# Patient Record
Sex: Male | Born: 1940 | Race: Black or African American | Hispanic: No | Marital: Single
Health system: Midwestern US, Community
[De-identification: ages and names within clinical notes are randomized; demographics above are authoritative.]

## PROBLEM LIST (undated history)

## (undated) DIAGNOSIS — Z85828 Personal history of other malignant neoplasm of skin: Secondary | ICD-10-CM

## (undated) DIAGNOSIS — M353 Polymyalgia rheumatica: Secondary | ICD-10-CM

## (undated) DIAGNOSIS — N400 Enlarged prostate without lower urinary tract symptoms: Secondary | ICD-10-CM

## (undated) DIAGNOSIS — R351 Nocturia: Secondary | ICD-10-CM

## (undated) DIAGNOSIS — Z973 Presence of spectacles and contact lenses: Secondary | ICD-10-CM

## (undated) DIAGNOSIS — D696 Thrombocytopenia, unspecified: Secondary | ICD-10-CM

## (undated) DIAGNOSIS — N183 Chronic kidney disease, stage 3 unspecified: Secondary | ICD-10-CM

## (undated) DIAGNOSIS — E538 Deficiency of other specified B group vitamins: Secondary | ICD-10-CM

## (undated) DIAGNOSIS — I10 Essential (primary) hypertension: Secondary | ICD-10-CM

## (undated) DIAGNOSIS — N200 Calculus of kidney: Secondary | ICD-10-CM

## (undated) DIAGNOSIS — Z87442 Personal history of urinary calculi: Secondary | ICD-10-CM

## (undated) DIAGNOSIS — R7303 Prediabetes: Secondary | ICD-10-CM

## (undated) HISTORY — PX: TONSILLECTOMY: SUR1361

## (undated) HISTORY — PX: CATARACT EXTRACTION: SUR2

---

## 1979-06-17 HISTORY — PX: CYSTOSCOPY: SUR368

## 2006-06-01 ENCOUNTER — Ambulatory Visit: Payer: Self-pay | Admitting: Internal Medicine

## 2006-06-12 ENCOUNTER — Ambulatory Visit: Payer: Self-pay | Admitting: Internal Medicine

## 2015-07-05 DIAGNOSIS — R8290 Unspecified abnormal findings in urine: Secondary | ICD-10-CM | POA: Diagnosis not present

## 2015-07-05 DIAGNOSIS — Z0000 Encounter for general adult medical examination without abnormal findings: Secondary | ICD-10-CM | POA: Diagnosis not present

## 2015-07-05 DIAGNOSIS — Z125 Encounter for screening for malignant neoplasm of prostate: Secondary | ICD-10-CM | POA: Diagnosis not present

## 2015-07-12 DIAGNOSIS — Z0000 Encounter for general adult medical examination without abnormal findings: Secondary | ICD-10-CM | POA: Diagnosis not present

## 2015-07-12 DIAGNOSIS — Z1389 Encounter for screening for other disorder: Secondary | ICD-10-CM | POA: Diagnosis not present

## 2015-07-12 DIAGNOSIS — N390 Urinary tract infection, site not specified: Secondary | ICD-10-CM | POA: Diagnosis not present

## 2015-07-12 DIAGNOSIS — N401 Enlarged prostate with lower urinary tract symptoms: Secondary | ICD-10-CM | POA: Diagnosis not present

## 2015-07-12 DIAGNOSIS — Z6827 Body mass index (BMI) 27.0-27.9, adult: Secondary | ICD-10-CM | POA: Diagnosis not present

## 2015-07-12 DIAGNOSIS — R319 Hematuria, unspecified: Secondary | ICD-10-CM | POA: Diagnosis not present

## 2015-07-12 DIAGNOSIS — M1990 Unspecified osteoarthritis, unspecified site: Secondary | ICD-10-CM | POA: Diagnosis not present

## 2015-07-30 DIAGNOSIS — Z1212 Encounter for screening for malignant neoplasm of rectum: Secondary | ICD-10-CM | POA: Diagnosis not present

## 2015-09-06 DIAGNOSIS — R3121 Asymptomatic microscopic hematuria: Secondary | ICD-10-CM | POA: Diagnosis not present

## 2015-10-04 DIAGNOSIS — N200 Calculus of kidney: Secondary | ICD-10-CM | POA: Diagnosis not present

## 2015-10-04 DIAGNOSIS — R3129 Other microscopic hematuria: Secondary | ICD-10-CM | POA: Diagnosis not present

## 2015-10-04 DIAGNOSIS — R3121 Asymptomatic microscopic hematuria: Secondary | ICD-10-CM | POA: Diagnosis not present

## 2015-10-07 DIAGNOSIS — H353132 Nonexudative age-related macular degeneration, bilateral, intermediate dry stage: Secondary | ICD-10-CM | POA: Diagnosis not present

## 2015-11-24 ENCOUNTER — Other Ambulatory Visit: Payer: Self-pay | Admitting: Urology

## 2015-11-25 NOTE — Patient Instructions (Addendum)
YOUR PROCEDURE IS SCHEDULED ON :  12/09/15  REPORT TO [facility] MAIN ENTRANCE FOLLOW SIGNS TO EAST ELEVATOR - GO TO 3rd FLOOR CHECK IN AT 3 EAST NURSES STATION (SHORT STAY) AT: 5:30 AM  CALL THIS NUMBER IF YOU HAVE PROBLEMS THE MORNING OF SURGERY [phone]  REMEMBER:ONLY 1 PER PERSON MAY GO TO SHORT STAY WITH YOU TO GET READY THE MORNING OF YOUR SURGERY  DO NOT EAT FOOD OR DRINK LIQUIDS AFTER MIDNIGHT  TAKE THESE MEDICINES THE MORNING OF SURGERY: NONE  YOU MAY NOT HAVE ANY METAL ON YOUR BODY INCLUDING HAIR PINS AND PIERCING'S. DO NOT WEAR JEWELRY, MAKEUP, LOTIONS, POWDERS OR PERFUMES. DO NOT WEAR NAIL POLISH. DO NOT SHAVE 48 HRS PRIOR TO SURGERY. MEN MAY SHAVE FACE AND NECK.  DO NOT BRING VALUABLES TO HOSPITAL. [facility] IS NOT RESPONSIBLE FOR VALUABLES.  CONTACTS, DENTURES OR PARTIALS MAY NOT BE WORN TO SURGERY. LEAVE SUITCASE IN CAR. CAN BE BROUGHT TO ROOM AFTER SURGERY.  PATIENTS DISCHARGED THE DAY OF SURGERY WILL NOT BE ALLOWED TO DRIVE HOME.  PLEASE READ OVER THE FOLLOWING INSTRUCTION SHEETS _________________________________________________________________________________                                          [facility] - PREPARING FOR SURGERY  Before surgery, you can play an important role.  Because skin is not sterile, your skin needs to be as free of germs as possible.  You can reduce the number of germs on your skin by washing with CHG (chlorahexidine gluconate) soap before surgery.  CHG is an antiseptic cleaner which kills germs and bonds with the skin to continue killing germs even after washing. Please DO NOT use if you have an allergy to CHG or antibacterial soaps.  If your skin becomes reddened/irritated stop using the CHG and inform your nurse when you arrive at Short Stay. Do not shave (including legs and underarms) for at least 48 hours prior to the first CHG shower.  You may shave your face. Please follow these instructions  carefully:   1.  Shower with CHG Soap the night before surgery and the  morning of Surgery.   2.  If you choose to wash your hair, wash your hair first as usual with your  normal  Shampoo.   3.  After you shampoo, rinse your hair and body thoroughly to remove the  shampoo.                                         4.  Use CHG as you would any other liquid soap.  You can apply chg directly  to the skin and wash . Gently wash with scrungie or clean wascloth    5.  Apply the CHG Soap to your body ONLY FROM THE NECK DOWN.   Do not use on open                           Wound or open sores. Avoid contact with eyes, ears mouth and genitals (private parts).                        Genitals (private parts) with your normal soap.  6.  Wash thoroughly, paying special attention to the area where your surgery  will be performed.   7.  Thoroughly rinse your body with warm water from the neck down.   8.  DO NOT shower/wash with your normal soap after using and rinsing off  the CHG Soap .                9.  Pat yourself dry with a clean towel.             10.  Wear clean night clothes to bed after shower             11.  Place clean sheets on your bed the night of your first shower and do not  sleep with pets.  Day of Surgery : Do not apply any lotions/deodorants the morning of surgery.  Please wear clean clothes to the hospital/surgery center.  FAILURE TO FOLLOW THESE INSTRUCTIONS MAY RESULT IN THE CANCELLATION OF YOUR SURGERY    PATIENT SIGNATURE_________________________________  ______________________________________________________________________                     CLEAR LIQUID DIET   Foods Allowed                                                                     Foods Excluded  Coffee and tea, regular and decaf                             liquids that you cannot  Plain Jell-O in any flavor                                             see through such  as: Fruit ices (not with fruit pulp)                                     milk, soups, orange juice  Iced Popsicles                                                        All solid food Carbonated beverages, regular and diet                                    Cranberry, grape and apple juices Sports drinks like Gatorade Lightly seasoned clear broth or consume(fat free) Sugar, honey syrup

## 2015-11-26 ENCOUNTER — Encounter (HOSPITAL_COMMUNITY)
Admission: RE | Admit: 2015-11-26 | Discharge: 2015-11-26 | Disposition: A | Discharge status: Home / Self Care | Payer: Medicare Other | Source: Ambulatory Visit | Attending: Urology | Admitting: Urology

## 2015-11-26 ENCOUNTER — Encounter (HOSPITAL_COMMUNITY): Payer: Self-pay

## 2015-11-26 DIAGNOSIS — N200 Calculus of kidney: Secondary | ICD-10-CM | POA: Diagnosis not present

## 2015-11-26 DIAGNOSIS — Z01812 Encounter for preprocedural laboratory examination: Secondary | ICD-10-CM | POA: Insufficient documentation

## 2015-11-26 HISTORY — DX: Personal history of other malignant neoplasm of skin: Z85.828

## 2015-11-26 HISTORY — DX: Nocturia: R35.1

## 2015-11-26 LAB — CBC
HCT: 46.8 % (ref 39.0–52.0)
HEMOGLOBIN: 15.6 g/dL (ref 13.0–17.0)
MCH: 31.5 pg (ref 26.0–34.0)
MCHC: 33.3 g/dL (ref 30.0–36.0)
MCV: 94.4 fL (ref 78.0–100.0)
PLATELETS: 143 10*3/uL — AB (ref 150–400)
RBC: 4.96 MIL/uL (ref 4.22–5.81)
RDW: 13.5 % (ref 11.5–15.5)
WBC: 5.9 10*3/uL (ref 4.0–10.5)

## 2015-11-26 LAB — BASIC METABOLIC PANEL
ANION GAP: 9 (ref 5–15)
BUN: 20 mg/dL (ref 6–20)
CALCIUM: 9.3 mg/dL (ref 8.9–10.3)
CO2: 29 mmol/L (ref 22–32)
CREATININE: 1.05 mg/dL (ref 0.61–1.24)
Chloride: 106 mmol/L (ref 101–111)
Glucose, Bld: 91 mg/dL (ref 65–99)
Potassium: 4.5 mmol/L (ref 3.5–5.1)
Sodium: 144 mmol/L (ref 135–145)

## 2015-12-08 ENCOUNTER — Encounter (HOSPITAL_COMMUNITY): Payer: Self-pay | Admitting: Anesthesiology

## 2015-12-08 NOTE — Anesthesia Preprocedure Evaluation (Signed)
Anesthesia Evaluation  Patient identified by MRN, date of birth, ID band Patient awake    Reviewed: Allergy & Precautions, NPO status , Patient's Chart, lab work & pertinent test results  Airway Mallampati: II  TM Distance: >3 FB Neck ROM: Full    Dental no notable dental hx.    Pulmonary former smoker,    Pulmonary exam normal breath sounds clear to auscultation       Cardiovascular negative cardio ROS Normal cardiovascular exam Rhythm:Regular Rate:Normal     Neuro/Psych negative neurological ROS  negative psych ROS   GI/Hepatic negative GI ROS, Neg liver ROS,   Endo/Other  negative endocrine ROS  Renal/GU Renal disease  negative genitourinary   Musculoskeletal negative musculoskeletal ROS (+)   Abdominal   Peds negative pediatric ROS (+)  Hematology negative hematology ROS (+)   Anesthesia Other Findings   Reproductive/Obstetrics negative OB ROS                             Anesthesia Physical Anesthesia Plan  ASA: II  Anesthesia Plan: General   Post-op Pain Management:    Induction: Intravenous  Airway Management Planned: Oral ETT  Additional Equipment:   Intra-op Plan:   Post-operative Plan: Extubation in OR  Informed Consent: I have reviewed the patients History and Physical, chart, labs and discussed the procedure including the risks, benefits and alternatives for the proposed anesthesia with the patient or authorized representative who has indicated his/her understanding and acceptance.   Dental advisory given  Plan Discussed with: CRNA  Anesthesia Plan Comments:         Anesthesia Quick Evaluation

## 2015-12-09 ENCOUNTER — Encounter (HOSPITAL_COMMUNITY): Payer: Self-pay | Admitting: *Deleted

## 2015-12-09 ENCOUNTER — Ambulatory Visit (HOSPITAL_COMMUNITY): Payer: Medicare Other

## 2015-12-09 ENCOUNTER — Ambulatory Visit (HOSPITAL_COMMUNITY): Payer: Medicare Other | Admitting: Anesthesiology

## 2015-12-09 ENCOUNTER — Encounter (HOSPITAL_COMMUNITY)
Admission: RE | Disposition: A | Discharge status: Home / Self Care | Payer: Self-pay | Source: Ambulatory Visit | Attending: Urology

## 2015-12-09 ENCOUNTER — Observation Stay (HOSPITAL_COMMUNITY)
Admission: RE | Admit: 2015-12-09 | Discharge: 2015-12-10 | Disposition: A | Discharge status: Home / Self Care | Payer: Medicare Other | Source: Ambulatory Visit | Attending: Urology | Admitting: Urology

## 2015-12-09 DIAGNOSIS — Z87442 Personal history of urinary calculi: Secondary | ICD-10-CM | POA: Diagnosis not present

## 2015-12-09 DIAGNOSIS — Z79899 Other long term (current) drug therapy: Secondary | ICD-10-CM | POA: Diagnosis not present

## 2015-12-09 DIAGNOSIS — N200 Calculus of kidney: Secondary | ICD-10-CM | POA: Diagnosis present

## 2015-12-09 DIAGNOSIS — Z87891 Personal history of nicotine dependence: Secondary | ICD-10-CM | POA: Diagnosis not present

## 2015-12-09 HISTORY — PX: NEPHROLITHOTOMY: SHX5134

## 2015-12-09 LAB — CBC
HCT: 45.2 % (ref 39.0–52.0)
Hemoglobin: 15 g/dL (ref 13.0–17.0)
MCH: 31.9 pg (ref 26.0–34.0)
MCHC: 33.2 g/dL (ref 30.0–36.0)
MCV: 96.2 fL (ref 78.0–100.0)
PLATELETS: 168 10*3/uL (ref 150–400)
RBC: 4.7 MIL/uL (ref 4.22–5.81)
RDW: 13.6 % (ref 11.5–15.5)
WBC: 12 10*3/uL — ABNORMAL HIGH (ref 4.0–10.5)

## 2015-12-09 LAB — BASIC METABOLIC PANEL
Anion gap: 10 (ref 5–15)
BUN: 17 mg/dL (ref 6–20)
CALCIUM: 8.1 mg/dL — AB (ref 8.9–10.3)
CO2: 24 mmol/L (ref 22–32)
CREATININE: 1.69 mg/dL — AB (ref 0.61–1.24)
Chloride: 105 mmol/L (ref 101–111)
GFR, EST AFRICAN AMERICAN: 44 mL/min — AB (ref >60)
GFR, EST NON AFRICAN AMERICAN: 38 mL/min — AB (ref >60)
Glucose, Bld: 165 mg/dL — ABNORMAL HIGH (ref 65–99)
Potassium: 3.8 mmol/L (ref 3.5–5.1)
SODIUM: 139 mmol/L (ref 135–145)

## 2015-12-09 SURGERY — NEPHROLITHOTOMY PERCUTANEOUS
Anesthesia: General | Site: Back | Laterality: Right

## 2015-12-09 MED ORDER — MENTHOL 3 MG MT LOZG
1.0000 | LOZENGE | OROMUCOSAL | Status: DC | PRN
  Filled 2015-12-09: qty 9

## 2015-12-09 MED ORDER — INFLUENZA VAC SPLIT QUAD 0.5 ML IM SUSY
0.5000 mL | PREFILLED_SYRINGE | INTRAMUSCULAR | Status: DC
  Filled 2015-12-09 (×2): qty 0.5

## 2015-12-09 MED ORDER — ONDANSETRON HCL 4 MG/2ML IJ SOLN
INTRAMUSCULAR | Status: DC | PRN
  Administered 2015-12-09: 4 mg via INTRAVENOUS

## 2015-12-09 MED ORDER — ONDANSETRON HCL 4 MG/2ML IJ SOLN: 4.0000 mg | INTRAMUSCULAR | Status: DC | PRN

## 2015-12-09 MED ORDER — DEXAMETHASONE SODIUM PHOSPHATE 10 MG/ML IJ SOLN
INTRAMUSCULAR | Status: AC
  Filled 2015-12-09: qty 1

## 2015-12-09 MED ORDER — PROPOFOL 10 MG/ML IV BOLUS
INTRAVENOUS | Status: AC
  Filled 2015-12-09: qty 20

## 2015-12-09 MED ORDER — CEFAZOLIN SODIUM-DEXTROSE 2-3 GM-% IV SOLR
2.0000 g | INTRAVENOUS | Status: AC
  Administered 2015-12-09: 2 g via INTRAVENOUS

## 2015-12-09 MED ORDER — SUCCINYLCHOLINE CHLORIDE 20 MG/ML IJ SOLN
INTRAMUSCULAR | Status: DC | PRN
  Administered 2015-12-09: 100 mg via INTRAVENOUS

## 2015-12-09 MED ORDER — HYDRALAZINE HCL 20 MG/ML IJ SOLN
5.0000 mg | INTRAMUSCULAR | Status: DC | PRN
  Administered 2015-12-10: 5 mg via INTRAVENOUS
  Filled 2015-12-09: qty 1

## 2015-12-09 MED ORDER — OXYCODONE HCL 5 MG PO TABS: 5.0000 mg | ORAL_TABLET | ORAL | Status: DC | PRN

## 2015-12-09 MED ORDER — LIDOCAINE-EPINEPHRINE (PF) 1 %-1:200000 IJ SOLN
INTRAMUSCULAR | Status: AC
  Filled 2015-12-09: qty 30

## 2015-12-09 MED ORDER — PROMETHAZINE HCL 25 MG/ML IJ SOLN: 6.2500 mg | INTRAMUSCULAR | Status: DC | PRN

## 2015-12-09 MED ORDER — LIDOCAINE HCL (CARDIAC) 20 MG/ML IV SOLN
INTRAVENOUS | Status: AC
  Filled 2015-12-09: qty 5

## 2015-12-09 MED ORDER — FUROSEMIDE 10 MG/ML IJ SOLN
INTRAMUSCULAR | Status: AC
  Filled 2015-12-09: qty 4

## 2015-12-09 MED ORDER — ROCURONIUM BROMIDE 100 MG/10ML IV SOLN
INTRAVENOUS | Status: AC
  Filled 2015-12-09: qty 1

## 2015-12-09 MED ORDER — FENTANYL CITRATE (PF) 250 MCG/5ML IJ SOLN
INTRAMUSCULAR | Status: AC
  Filled 2015-12-09: qty 5

## 2015-12-09 MED ORDER — EPHEDRINE SULFATE 50 MG/ML IJ SOLN
INTRAMUSCULAR | Status: AC
  Filled 2015-12-09: qty 1

## 2015-12-09 MED ORDER — CIPROFLOXACIN IN D5W 400 MG/200ML IV SOLN
400.0000 mg | INTRAVENOUS | Status: AC
Start: 2015-12-09 — End: 2015-12-09
  Administered 2015-12-09: 400 mg via INTRAVENOUS

## 2015-12-09 MED ORDER — PHENOL 1.4 % MT LIQD
1.0000 | OROMUCOSAL | Status: DC | PRN
  Filled 2015-12-09: qty 177

## 2015-12-09 MED ORDER — PHENYLEPHRINE HCL 10 MG/ML IJ SOLN
INTRAMUSCULAR | Status: AC
  Filled 2015-12-09: qty 1

## 2015-12-09 MED ORDER — IOHEXOL 300 MG/ML  SOLN
INTRAMUSCULAR | Status: DC | PRN
  Administered 2015-12-09: 30 mL

## 2015-12-09 MED ORDER — PROPOFOL 10 MG/ML IV BOLUS
INTRAVENOUS | Status: DC | PRN
  Administered 2015-12-09: 200 mg via INTRAVENOUS

## 2015-12-09 MED ORDER — SUGAMMADEX SODIUM 200 MG/2ML IV SOLN
INTRAVENOUS | Status: AC
  Filled 2015-12-09: qty 2

## 2015-12-09 MED ORDER — CEFAZOLIN SODIUM-DEXTROSE 2-3 GM-% IV SOLR
INTRAVENOUS | Status: AC
  Filled 2015-12-09: qty 50

## 2015-12-09 MED ORDER — LACTATED RINGERS IV SOLN
INTRAVENOUS | Status: DC | PRN
  Administered 2015-12-09 (×2): via INTRAVENOUS

## 2015-12-09 MED ORDER — LACTATED RINGERS IV SOLN: INTRAVENOUS | Status: DC

## 2015-12-09 MED ORDER — SODIUM CHLORIDE 0.9 % IJ SOLN
INTRAMUSCULAR | Status: AC
Start: 2015-12-09 — End: 2015-12-09
  Filled 2015-12-09: qty 10

## 2015-12-09 MED ORDER — ACETAMINOPHEN 10 MG/ML IV SOLN
1000.0000 mg | Freq: Four times a day (QID) | INTRAVENOUS | Status: AC
  Administered 2015-12-09 – 2015-12-10 (×4): 1000 mg via INTRAVENOUS
  Filled 2015-12-09 (×3): qty 100

## 2015-12-09 MED ORDER — SUGAMMADEX SODIUM 200 MG/2ML IV SOLN
INTRAVENOUS | Status: DC | PRN
  Administered 2015-12-09: 200 mg via INTRAVENOUS

## 2015-12-09 MED ORDER — FENTANYL CITRATE (PF) 100 MCG/2ML IJ SOLN
INTRAMUSCULAR | Status: DC | PRN
  Administered 2015-12-09 (×4): 50 ug via INTRAVENOUS

## 2015-12-09 MED ORDER — DOCUSATE SODIUM 100 MG PO CAPS
100.0000 mg | ORAL_CAPSULE | Freq: Two times a day (BID) | ORAL | Status: DC
  Administered 2015-12-09: 100 mg via ORAL

## 2015-12-09 MED ORDER — LIDOCAINE HCL (CARDIAC) 20 MG/ML IV SOLN
INTRAVENOUS | Status: DC | PRN
  Administered 2015-12-09: 100 mg via INTRAVENOUS

## 2015-12-09 MED ORDER — HYDROMORPHONE HCL 1 MG/ML IJ SOLN: 0.2500 mg | INTRAMUSCULAR | Status: DC | PRN

## 2015-12-09 MED ORDER — LACTATED RINGERS IV SOLN
INTRAVENOUS | Status: DC
  Administered 2015-12-09 – 2015-12-10 (×3): via INTRAVENOUS

## 2015-12-09 MED ORDER — OXYCODONE HCL 5 MG PO TABS
5.0000 mg | ORAL_TABLET | ORAL | Status: DC | PRN
Start: 2015-12-09 — End: 2015-12-10

## 2015-12-09 MED ORDER — DOCUSATE SODIUM 100 MG PO CAPS: 100.0000 mg | ORAL_CAPSULE | Freq: Two times a day (BID) | ORAL | Status: DC | PRN

## 2015-12-09 MED ORDER — BUPIVACAINE HCL (PF) 0.5 % IJ SOLN
INTRAMUSCULAR | Status: AC
  Filled 2015-12-09: qty 30

## 2015-12-09 MED ORDER — PHENYLEPHRINE HCL 10 MG/ML IJ SOLN
10.0000 mg | INTRAVENOUS | Status: DC | PRN
  Administered 2015-12-09: 50 ug/min via INTRAVENOUS

## 2015-12-09 MED ORDER — ONDANSETRON HCL 4 MG/2ML IJ SOLN
INTRAMUSCULAR | Status: AC
  Filled 2015-12-09: qty 2

## 2015-12-09 MED ORDER — ONDANSETRON HCL 4 MG/2ML IJ SOLN: 4.0000 mg | Freq: Four times a day (QID) | INTRAMUSCULAR | Status: DC | PRN

## 2015-12-09 MED ORDER — SODIUM CHLORIDE 0.9 % IR SOLN
Status: DC | PRN
  Administered 2015-12-09: 7000 mL via INTRAVESICAL

## 2015-12-09 MED ORDER — ZOLPIDEM TARTRATE 5 MG PO TABS: 5.0000 mg | ORAL_TABLET | Freq: Every evening | ORAL | Status: DC | PRN

## 2015-12-09 MED ORDER — ACETAMINOPHEN 10 MG/ML IV SOLN
INTRAVENOUS | Status: AC
  Filled 2015-12-09: qty 100

## 2015-12-09 MED ORDER — BACITRACIN-NEOMYCIN-POLYMYXIN 400-5-5000 EX OINT: 1.0000 "application " | TOPICAL_OINTMENT | Freq: Three times a day (TID) | CUTANEOUS | Status: DC | PRN

## 2015-12-09 MED ORDER — LIDOCAINE-EPINEPHRINE (PF) 1 %-1:200000 IJ SOLN
INTRAMUSCULAR | Status: DC | PRN
  Administered 2015-12-09: 30 mL

## 2015-12-09 MED ORDER — DEXAMETHASONE SODIUM PHOSPHATE 10 MG/ML IJ SOLN
INTRAMUSCULAR | Status: DC | PRN
  Administered 2015-12-09: 10 mg via INTRAVENOUS

## 2015-12-09 MED ORDER — ROCURONIUM BROMIDE 100 MG/10ML IV SOLN
INTRAVENOUS | Status: DC | PRN
  Administered 2015-12-09: 20 mg via INTRAVENOUS
  Administered 2015-12-09: 10 mg via INTRAVENOUS
  Administered 2015-12-09: 5 mg via INTRAVENOUS
  Administered 2015-12-09: 30 mg via INTRAVENOUS

## 2015-12-09 MED ORDER — FUROSEMIDE 10 MG/ML IJ SOLN
40.0000 mg | Freq: Once | INTRAMUSCULAR | Status: AC
  Administered 2015-12-09: 40 mg via INTRAVENOUS

## 2015-12-09 MED ORDER — EPHEDRINE SULFATE 50 MG/ML IJ SOLN
INTRAMUSCULAR | Status: DC | PRN
  Administered 2015-12-09: 10 mg via INTRAVENOUS
  Administered 2015-12-09: 5 mg via INTRAVENOUS
  Administered 2015-12-09: 10 mg via INTRAVENOUS
  Administered 2015-12-09: 5 mg via INTRAVENOUS
  Administered 2015-12-09: 10 mg via INTRAVENOUS

## 2015-12-09 MED ORDER — CIPROFLOXACIN IN D5W 400 MG/200ML IV SOLN
INTRAVENOUS | Status: AC
  Filled 2015-12-09: qty 200

## 2015-12-09 SURGICAL SUPPLY — 56 items
BAG URINE DRAINAGE (UROLOGICAL SUPPLIES) ×4 IMPLANT
BASKET DAKOTA 1.9FR 11X120 (BASKET) ×2 IMPLANT
BASKET STNLS GEMINI 4WIRE 3FR (BASKET) IMPLANT
BASKET STONE NCOMPASS (UROLOGICAL SUPPLIES) IMPLANT
BASKET ZERO TIP NITINOL 2.4FR (BASKET) IMPLANT
BENZOIN TINCTURE PRP APPL 2/3 (GAUZE/BANDAGES/DRESSINGS) ×4 IMPLANT
BLADE SURG 15 STRL LF DISP TIS (BLADE) ×1 IMPLANT
BLADE SURG 15 STRL SS (BLADE) ×1
CATH AINSWORTH 30CC 24FR (CATHETERS) IMPLANT
CATH FOLEY 2WAY SLVR  5CC 16FR (CATHETERS) ×1
CATH FOLEY 2WAY SLVR 30CC 24FR (CATHETERS) ×2 IMPLANT
CATH FOLEY 2WAY SLVR 5CC 16FR (CATHETERS) ×1 IMPLANT
CATH FOLEY LATEX FREE 16FR (CATHETERS) ×2 IMPLANT
CATH IMAGER II 65CM (CATHETERS) ×6 IMPLANT
CATH URET 5FR 28IN OPEN ENDED (CATHETERS) ×2 IMPLANT
CATH X-FORCE N30 NEPHROSTOMY (TUBING) ×2 IMPLANT
CHLORAPREP W/TINT 26ML (MISCELLANEOUS) ×2 IMPLANT
COVER SURGICAL LIGHT HANDLE (MISCELLANEOUS) ×2 IMPLANT
DRAPE C-ARM 42X120 X-RAY (DRAPES) ×2 IMPLANT
DRAPE LINGEMAN PERC (DRAPES) ×2 IMPLANT
DRSG PAD ABDOMINAL 8X10 ST (GAUZE/BANDAGES/DRESSINGS) ×4 IMPLANT
DRSG TEGADERM 4X4.75 (GAUZE/BANDAGES/DRESSINGS) ×2 IMPLANT
DRSG TEGADERM 8X12 (GAUZE/BANDAGES/DRESSINGS) ×4 IMPLANT
FIBER LASER FLEXIVA 1000 (UROLOGICAL SUPPLIES) IMPLANT
FIBER LASER FLEXIVA 200 (UROLOGICAL SUPPLIES) IMPLANT
FIBER LASER FLEXIVA 365 (UROLOGICAL SUPPLIES) IMPLANT
FIBER LASER FLEXIVA 550 (UROLOGICAL SUPPLIES) IMPLANT
FIBER LASER TRAC TIP (UROLOGICAL SUPPLIES) IMPLANT
GAUZE SPONGE 4X4 12PLY STRL (GAUZE/BANDAGES/DRESSINGS) ×2 IMPLANT
GAUZE SPONGE 4X4 16PLY XRAY LF (GAUZE/BANDAGES/DRESSINGS) ×2 IMPLANT
GLOVE BIOGEL M STRL SZ7.5 (GLOVE) ×2 IMPLANT
GOWN STRL REUS W/TWL XL LVL3 (GOWN DISPOSABLE) ×2 IMPLANT
GUIDEWIRE AMPLAZ .035X145 (WIRE) ×4 IMPLANT
GUIDEWIRE ANG ZIPWIRE 038X150 (WIRE) ×2 IMPLANT
GUIDEWIRE STR DUAL SENSOR (WIRE) ×4 IMPLANT
INSERT SILICONE FOR G14464 (MISCELLANEOUS) ×2 IMPLANT
IV NS IRRIG 3000ML ARTHROMATIC (IV SOLUTION) ×18 IMPLANT
IV SET EXTENSION CATH 6 NF (IV SETS) ×2 IMPLANT
KIT BASIN OR (CUSTOM PROCEDURE TRAY) ×2 IMPLANT
MANIFOLD NEPTUNE II (INSTRUMENTS) ×2 IMPLANT
NEEDLE TROCAR 18X15 ECHO (NEEDLE) IMPLANT
NEEDLE TROCAR 18X20 (NEEDLE) IMPLANT
NS IRRIG 1000ML POUR BTL (IV SOLUTION) ×2 IMPLANT
PACK CYSTO (CUSTOM PROCEDURE TRAY) ×2 IMPLANT
PROBE LITHOCLAST ULTRA 3.8X403 (UROLOGICAL SUPPLIES) IMPLANT
PROBE PNEUMATIC 1.0MMX570MM (UROLOGICAL SUPPLIES) IMPLANT
SHEATH PEELAWAY SET 9 (SHEATH) ×2 IMPLANT
SPONGE LAP 4X18 X RAY DECT (DISPOSABLE) ×2 IMPLANT
STENT CONTOUR 6FRX28X.038 (STENTS) ×2 IMPLANT
STONE CATCHER W/TUBE ADAPTER (UROLOGICAL SUPPLIES) ×4 IMPLANT
SUT SILK 0 FSL (SUTURE) ×2 IMPLANT
SYR 20CC LL (SYRINGE) ×4 IMPLANT
SYR 50ML LL SCALE MARK (SYRINGE) ×2 IMPLANT
SYRINGE 10CC LL (SYRINGE) ×2 IMPLANT
TOWEL OR 17X26 10 PK STRL BLUE (TOWEL DISPOSABLE) ×2 IMPLANT
TUBING CONNECTING 10 (TUBING) ×4 IMPLANT

## 2015-12-09 NOTE — Op Note (Signed)
Pre-operative diagnosis: right renal pelvis stones > 2.0, smaller stones in the lower pole Post-operative diagnosis: as above   Procedure performed: cystoscopy, right retrograde pyelogram with interpretation, right percutaneous renal access, right nephrolithotomy, right nephrostogram, right ureteral placement exchange  Surgeon: Dr. Ardis Hughs   Anesthesia: General  Complications: None  Specimens: The majority of the stones were removed and will be sent to the [facility] lab for further analysis.   Findings: 1. Lower pole access. 2. 28x6F double J stent left in right ureter. 3. No nephrostomy tube left at the end of the case 4. Retrograde pyelogram demonstrated a normal caliber ureter. There was a large filling defect in the renal pelvis. The calyces were largely blunted. It was a simple collecting system with 4 large calyces.  EBL: Approximately 450 cc  Specimens: stone from collecting system - taken to [facility] lab  Indication: Tyler Wade is a 75 y.o. patient with large stone burden. After reviewing the management options for treatment, he elected to proceed with the above surgical procedure(s). We have discussed the potential benefits and risks of the procedure, side effects of the proposed treatment, the likelihood of the patient achieving the goals of the procedure, and any potential problems that might occur during the procedure or recuperation. Informed consent has been obtained.   Description:  Consent was obtained in the preoperative holding area. The patient was marked appropriately and then taken back to the operating room where she was intubated on the gurney. The patient was flipped prone onto the split leg OR table. Large jelly rolls were placed in the anterior axillary line on both sides allowing the patient's chest and abdomen to fall inbetween. The patient was then prepped and draped in the routine sterile fashion in the right flank and  perineal/vaginal area. A timeout was then held confirming the proper side and procedure as well as antibiotics were administered.  I then used the flexible cystoscope and passed gently into the patient's urethra under visual guidance. Once into the bladder I located the patient's right ureteral orifice and I then passed a wire through into up into the right collecting system. I then passed a a 5 [place] open-ended ureteral Pollock catheter over the wire. The Pollack catheter was then advanced up to the UPJ. The wire was then removed and a retrograde pyelogram was performed with the above findings. I then placed a 70 French Foley catheter and I then turned my attention to the patient's right flank and obtaining percutaneous renal access.  Using the C-arm rotated at 25 and the bulls-eye technique with an 18-gauge coaxial needle the upper pole posterior lateral calyx was targeted. Then rotating the C-arm AP depth of our needle was noted to be within the calyx and the inner part of the coaxial needle was removed. Urine was noted to return. A 0.38 sensor wire was then passed through the sheath of the coaxial needle and into the right renal collecting system. The wire was then passed down the ureter and into the bladder using fluoroscopic guide and the sheath of the needle was removed. An angiographic catheter was then advanced into the bladder and the wire removed. A Super Stiff wire was then passed into the angiographic catheter and the angiographic catheter removed. A 9 French peel-away dilator was then advanced over the Super Stiff wire and passed into the renal pelvis and across the UPJ under fluoroscopic guidance. The inner part of this dilator was removed and the 0.38 sensor  wire was passed alongside the Super Stiff wire through the dilator and into the right ureter and down into the bladder. The angiographic catheter again was passed over the guidewire and advanced into the bladder, the wire was then removed.  A Super Stiff wire was then passed through angiographic catheter and angiographic catheter removed. The outer part of the sheath was then removed, establishing 2 superstiff wires through the lower pole calyx and into the bladder.   The 15 French NephroMax balloon was then passed over one of the Super Stiff wires and the tip guided down into the right upper pole calyx. The balloon was then inflated to approximately 12 atm, and once there was no waist noted under fluoroscopy the access sheath was advanced over the balloon. The balloon was then removed. The wires were then placed back into the sheaths and snapped to the drape.   Using the rigid nephroscope to explore the renal pelvis I encountered a large calculus at the UPJ which I fragmented using the lithoclast device and was removed using the 2 prong grasper. Then using a flexible cystoscope to navigate the remaining calyces of the kidney multiple smaller stone fragments encountered, mostly in the lower pole calyces. Using the 0 tip basket these fragments were grabbed and removed.    Then using the flexible ureteroscope the ureter was navigated in antegrade fashion. All stone fragments were pushed from the ureter into the bladder. Once the ureter was clear the scope was advanced into the bladder and a 0.38 sensor wire was left in the bladder and the scope backed out over wire. The sensor wire was then backloaded over the rigid nephroscope using the stent pusher and a 28cm x 6 French double-J ureteral stent was passed antegrade over the sensor wire down into the bladder under fluoroscopic guidance. Once the stent was in the bladder the wire was gently pulled back and a nice curl noted in the bladder. The wire completely removed from the stent, and nice curl on the proximal end of the stent was noted in the renal pelvis. The sheath was then backed out slowly to ensure that all calyces had been inspected and there was nothing behind the sheath.   A  catheter  was then passed over one of the Super Stiff wires through the sheath and into the renal pelvis. The sheath was then backed out of the kidney and cut off the red rubber catheter. A nephrostogram was then performed confirming the position of our nephrostomy tube and reassuring that there were no longer any filling defects from the patient's symptoms. After several minutes of direct pressure and observation there was no significant bleeding from the nephrostomy tube or around the nephrostomy tube tract. As such, I remove the nephrostomy tube as well as the safety wire. 25 cc of local anesthesia was then injected into the patient's wound, and the wound was closed with 3-0 silk in 2 vertical mattress sutures. The incision was then padded using a bundle of 4 x 4's and Tegaderm. Patient was subsequently rolled over to the supine position and extubated. He was returned to the PACU in excellent condition. At the end of the case all lap and needle and sponges were accounted for. There are no perioperative complications.

## 2015-12-09 NOTE — Progress Notes (Signed)
Received pt from PACU, alert and oriented, f/c intact, clear pink urine noted, VS obtained, oriented to unit, call light placed in reach

## 2015-12-09 NOTE — Discharge Instructions (Signed)
Discharge instructions following PCNL  Call your doctor for: Fevers greater than 100.5 Severe nausea or vomiting Increasing pain not controlled by pain medication Increasing redness or drainage from incisions Decreased urine output or a catheter is no longer draining  The number for questions is [phone].  Activity: Gradually increase activity with short frequent walks, 3-4 times a day.  Avoid strenuous activities, like sports, lawn-mowing, or heavy lifting (more than 10-15 pounds).  Wear loose, comfortable clothing that pull or kink the tube or tubes.  Do not drive while taking pain medication, or until your doctor permitts it.  Bathing and dressing changes: You should not shower for 48 hours after surgery.  Do not soak your back in a bathtub.  Diet: It is extremely important to drink plenty of fluids after surgery, especially water.  You may resume your regular diet, unless otherwise instructed.  Medications: May take Tylenol (acetaminophen) or ibuprofen (Advil, Motrin) as directed over-the-counter. Take any prescriptions as directed.  Follow-up appointments: Follow-up appointment will be scheduled with Dr. Derran Sear in 10-14 days for hospital check and stent removal.  

## 2015-12-09 NOTE — Anesthesia Postprocedure Evaluation (Signed)
Anesthesia Post Note  Patient: Tyler Wade  Procedure(s) Performed: Procedure(s) (LRB): NEPHROLITHOTOMY RIGHT PERCUTANEOUS WITH SURGEON ACCESS (Right)  Patient location during evaluation: PACU Anesthesia Type: General Level of consciousness: awake and alert Pain management: pain level controlled Vital Signs Assessment: post-procedure vital signs reviewed and stable Respiratory status: spontaneous breathing, nonlabored ventilation, respiratory function stable and patient connected to nasal cannula oxygen Cardiovascular status: blood pressure returned to baseline and stable Postop Assessment: no signs of nausea or vomiting Anesthetic complications: no    Last Vitals:  Filed Vitals:   12/09/15 1205 12/09/15 1221  BP:  157/81  Pulse:  91  Temp: 36.9 C 36.8 C  Resp:  16    Last Pain:  Filed Vitals:   12/09/15 1228  PainSc: 2                  Rakesha Dalporto J

## 2015-12-09 NOTE — H&P (Signed)
Reason For Visit Right partial staghorn   History of Present Illness 63M presented ~85months ago for microscopic hematuria. He was found to have a large right UPJ stone with stones in the lower pole. He opted to follow-up in 3 months. He has a history of nephrolithiasis in 107s. He has moderate obstructive voiding symptoms.  BUN/Cr 17/1.1 on 07/12/15. PSA 1.16. The patient has no past medical history of any significance. He is not taking any medications. 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 He has not had any ongoing flank pain, hematuria, fevers or chills, or voiding symptoms.   Past Medical History Problems  1. History of kidney stones (Z87.442) 2. History of No significant past medical history  Surgical History Problems  1. History of Tonsillectomy  Current Meds 1. MiraLax Oral Powder;  Therapy: (Recorded:21Nov2016) to Recorded 2. PreserVision AREDS CAPS;  Therapy: (Recorded:21Nov2016) to Recorded  Allergies Medication  1. No Known Drug Allergies  Family History Problems  1. Family history of Death of family member : Mother, Father   Mother at age 26; childbirthFather at age 40; old age  Social History Problems  1. Denied: History of Alcohol use 2. Caffeine use (F15.90)   1- tea and cola 3. Former smoker (Z87.891)   < 1ppd; smoked for 5 years and quit 51 years ago 4. Married 5. Number of children   1 son and 1 daughter 49. Retired  Review of Systems Patient denies any dyspnea on exertion, chest pain, or shortness of breath.   Vitals Vital Signs [Data Includes: Last 1 Day]  Recorded: 06Feb2017 08:18AM  Blood Pressure: 177 / 77 Heart Rate: 67  Physical Exam Constitutional: Well nourished and well developed . No acute distress.  ENT:. The ears and nose are normal in appearance.  Neck: The appearance of the neck is normal and no neck mass is present.  Pulmonary: No respiratory distress and  normal respiratory rhythm and effort.  Cardiovascular: Heart rate and rhythm are normal . No peripheral edema.  Abdomen: The abdomen is soft and nontender. No masses are palpated. No CVA tenderness. No hernias are palpable. No hepatosplenomegaly noted.  Neuro/Psych:. Mood and affect are appropriate.    Results/Data Urine [Data Includes: Last 1 Day]   06Feb2017  COLOR YELLOW   APPEARANCE CLEAR   SPECIFIC GRAVITY 1.010   pH 6.0   GLUCOSE NEGATIVE   BILIRUBIN NEGATIVE   KETONE NEGATIVE   BLOOD NEGATIVE   PROTEIN NEGATIVE   NITRITE NEGATIVE   LEUKOCYTE ESTERASE NEGATIVE    Patient's urinalysis today is clear.  KUB: Was obtained today in clinic to evaluate the patient's stone burden. The stones are clearly visible in the right renal pelvis and lower pole. The largest stone measures 2.8 cm long and approximately 1.8 cm wide. There are no other stones along the expected trajectory of the right ureter. The left collecting system is clear. The gas pattern is grossly normal. There are no significant or obvious bony abnormalities.   Plan Asymptomatic microscopic hematuria  1. Follow-up Schedule Surgery Office  Follow-up  Status: Hold For - Appointment   Requested for: 06Feb2017 2. URINE CULTURE; Status:In Progress - Specimen/Data Collected;   Done: 06Feb2017 Health Maintenance  3. UA With REFLEX; [Do Not Release]; Status:Complete;   Done: 06Feb2017 07:57AM  Discussion/Summary The patient has a right partial staghorn calculi. We discussed treatment options. Ultimately, I recommended the patient consider a right percutaneous nephrolithotomy. I discussed the  procedure the patient PCNL, with surgeon access, and the risks associated with the surgery. The patient understands that there is a risk of bleeding and injury to the surrounding structures. He also understands the chance of a second procedure, likely ureteroscopy. The patient also understands that he will have a ureteral stent placed at the  time surgery and that he will need a nephrostomy tube. I discussed the hospitalization and the expected recovery with the patient. The patient has agreed to proceed, we'll try to get this scheduled at his convenience. Given the patient's good health, I don't think he needs to be cleared preoperatively by his primary care doctor.

## 2015-12-09 NOTE — Transfer of Care (Signed)
Immediate Anesthesia Transfer of Care Note  Patient: Tyler Wade  Procedure(s) Performed: Procedure(s): NEPHROLITHOTOMY RIGHT PERCUTANEOUS WITH SURGEON ACCESS (Right)  Patient Location: PACU  Anesthesia Type:General  Level of Consciousness: awake, alert  and patient cooperative  Airway & Oxygen Therapy: Patient Spontanous Breathing and Patient connected to face mask oxygen  Post-op Assessment: Report given to RN  Post vital signs: Reviewed and stable  Last Vitals:  Filed Vitals:   12/09/15 0525  BP: 169/83  Pulse: 66  Temp: 36.7 C  Resp: 16    Complications: No apparent anesthesia complications

## 2015-12-09 NOTE — Anesthesia Procedure Notes (Signed)
Procedure Name: Intubation Date/Time: 12/09/2015 7:41 AM Performed by: Orest Dikes Pre-anesthesia Checklist: Patient identified, Emergency Drugs available, Suction available, Patient being monitored and Timeout performed Patient Re-evaluated:Patient Re-evaluated prior to inductionOxygen Delivery Method: Circle system utilized Preoxygenation: Pre-oxygenation with 100% oxygen Intubation Type: IV induction and Cricoid Pressure applied Ventilation: Mask ventilation without difficulty Laryngoscope Size: Mac and 3 Grade View: Grade II Tube type: Oral Tube size: 7.5 mm Number of attempts: 1

## 2015-12-10 ENCOUNTER — Encounter (HOSPITAL_COMMUNITY): Payer: Self-pay | Admitting: Radiology

## 2015-12-10 ENCOUNTER — Observation Stay (HOSPITAL_COMMUNITY): Payer: Medicare Other

## 2015-12-10 DIAGNOSIS — N200 Calculus of kidney: Secondary | ICD-10-CM | POA: Diagnosis not present

## 2015-12-10 LAB — BASIC METABOLIC PANEL
ANION GAP: 9 (ref 5–15)
BUN: 21 mg/dL — ABNORMAL HIGH (ref 6–20)
CHLORIDE: 105 mmol/L (ref 101–111)
CO2: 27 mmol/L (ref 22–32)
Calcium: 8.5 mg/dL — ABNORMAL LOW (ref 8.9–10.3)
Creatinine, Ser: 1.52 mg/dL — ABNORMAL HIGH (ref 0.61–1.24)
GFR calc non Af Amer: 43 mL/min — ABNORMAL LOW (ref >60)
GFR, EST AFRICAN AMERICAN: 50 mL/min — AB (ref >60)
Glucose, Bld: 112 mg/dL — ABNORMAL HIGH (ref 65–99)
Potassium: 3.8 mmol/L (ref 3.5–5.1)
Sodium: 141 mmol/L (ref 135–145)

## 2015-12-10 LAB — CBC
HCT: 39.7 % (ref 39.0–52.0)
HEMOGLOBIN: 13 g/dL (ref 13.0–17.0)
MCH: 31.5 pg (ref 26.0–34.0)
MCHC: 32.7 g/dL (ref 30.0–36.0)
MCV: 96.1 fL (ref 78.0–100.0)
Platelets: 153 10*3/uL (ref 150–400)
RBC: 4.13 MIL/uL — AB (ref 4.22–5.81)
RDW: 13.9 % (ref 11.5–15.5)
WBC: 10.4 10*3/uL (ref 4.0–10.5)

## 2015-12-10 NOTE — Discharge Summary (Signed)
Date of admission: 12/09/2015  Date of discharge: 12/10/2015  Admission diagnosis: right nephrolithiasis  Discharge diagnosis: as above, s/p right PCNL  Secondary diagnoses:  Patient Active Problem List   Diagnosis Date Noted  . Nephrolithiasis 12/09/2015    History and Physical: For full details, please see admission history and physical. Briefly, Tyler Wade is a 75 y.o. year old patient with right nephrolithiasis. .  Day of discharge exam: Filed Vitals:   12/09/15 1205 12/09/15 1221 12/09/15 2143 12/10/15 0558  BP:  157/81 145/66 160/70  Pulse:  91 84 64  Temp: 98.4 F (36.9 C) 98.2 F (36.8 C) 98.2 F (36.8 C) 98.2 F (36.8 C)  TempSrc:   Oral Oral  Resp:  16 16 16   Height:      Weight:      SpO2:  100% 96% 96%    Intake/Output Summary (Last 24 hours) at 12/10/15 0853 Last data filed at 12/10/15 7619  Gross per 24 hour  Intake 3362.91 ml  Output   4050 ml  Net -687.09 ml   NAD Right flank is c/d/i Non-labored breathing Extremities symmetric   Hospital Course: Patient tolerated the procedure well.  He was then transferred to the floor after an uneventful PACU stay.  His hospital course was uncomplicated.  On POD#1  he had met discharge criteria: was eating a regular diet, was up and ambulating independently,  pain was well controlled, was voiding without a catheter, and was ready to for discharge.  CT scan: 42m fragment in the lower pole of the right kidney - stent in good position, small perinephric hematoma.  Laboratory values:   Recent Labs  12/09/15 1148 12/10/15 0510  WBC 12.0* 10.4  HGB 15.0 13.0  HCT 45.2 39.7    Recent Labs  12/09/15 1148 12/10/15 0510  NA 139 141  K 3.8 3.8  CL 105 105  CO2 24 27  GLUCOSE 165* 112*  BUN 17 21*  CREATININE 1.69* 1.52*  CALCIUM 8.1* 8.5*   No results for input(s): LABPT, INR in the last 72 hours. No results for input(s): LABURIN in the last 72 hours. No results found for this or any previous  visit.  Disposition: Home  Discharge instruction: The patient was instructed to be ambulatory but told to refrain from heavy lifting, strenuous activity, or driving.   Discharge medications:   Medication List    TAKE these medications        docusate sodium 100 MG capsule  Commonly known as:  COLACE  Take 1 capsule (100 mg total) by mouth 2 (two) times daily as needed for mild constipation (take to keep stool soft.).     oxyCODONE 5 MG immediate release tablet  Commonly known as:  ROXICODONE  Take 1 tablet (5 mg total) by mouth every 4 (four) hours as needed.     polyethylene glycol packet  Commonly known as:  MIRALAX / GLYCOLAX  Take 17 g by mouth every other day.     PRESERVISION AREDS 2 PO  Take 1 tablet by mouth 2 (two) times daily.        Followup:      Follow-up Information    Follow up with HArdis Hughs MD On 12/23/2015.   Specialty:  Urology   Why:  1:45p   Contact information:   5[place] [phone]

## 2015-12-10 NOTE — Progress Notes (Signed)
Patient discharged @ [phone] in stable condition.  Educated pt regarding care of incision, medication.  Prescription given.  Discharge instructions given.  Teach back completed.

## 2015-12-13 ENCOUNTER — Other Ambulatory Visit: Payer: Self-pay | Admitting: Urology

## 2015-12-23 ENCOUNTER — Encounter (HOSPITAL_BASED_OUTPATIENT_CLINIC_OR_DEPARTMENT_OTHER): Payer: Self-pay | Admitting: *Deleted

## 2015-12-23 NOTE — Progress Notes (Signed)
NPO AFTER MN.  ARRIVE AT 0700.  CURRENT LAB RESULTS IN CHART AND EPIC.  MAY TAKE PAIN RX IF NEEDED W/ SIPS OF WATER.

## 2015-12-29 ENCOUNTER — Ambulatory Visit (HOSPITAL_BASED_OUTPATIENT_CLINIC_OR_DEPARTMENT_OTHER)
Admission: RE | Admit: 2015-12-29 | Discharge: 2015-12-29 | Disposition: A | Discharge status: Home / Self Care | Payer: Medicare Other | Source: Ambulatory Visit | Attending: Urology | Admitting: Urology

## 2015-12-29 ENCOUNTER — Encounter (HOSPITAL_BASED_OUTPATIENT_CLINIC_OR_DEPARTMENT_OTHER): Payer: Self-pay | Admitting: *Deleted

## 2015-12-29 ENCOUNTER — Ambulatory Visit (HOSPITAL_BASED_OUTPATIENT_CLINIC_OR_DEPARTMENT_OTHER): Payer: Medicare Other | Admitting: Anesthesiology

## 2015-12-29 ENCOUNTER — Encounter (HOSPITAL_BASED_OUTPATIENT_CLINIC_OR_DEPARTMENT_OTHER)
Admission: RE | Disposition: A | Discharge status: Home / Self Care | Payer: Self-pay | Source: Ambulatory Visit | Attending: Urology

## 2015-12-29 DIAGNOSIS — N200 Calculus of kidney: Secondary | ICD-10-CM | POA: Diagnosis present

## 2015-12-29 DIAGNOSIS — Z79899 Other long term (current) drug therapy: Secondary | ICD-10-CM | POA: Diagnosis not present

## 2015-12-29 DIAGNOSIS — Z87891 Personal history of nicotine dependence: Secondary | ICD-10-CM | POA: Insufficient documentation

## 2015-12-29 DIAGNOSIS — Z87442 Personal history of urinary calculi: Secondary | ICD-10-CM | POA: Diagnosis not present

## 2015-12-29 HISTORY — DX: Calculus of kidney: N20.0

## 2015-12-29 HISTORY — DX: Presence of spectacles and contact lenses: Z97.3

## 2015-12-29 HISTORY — DX: Personal history of urinary calculi: Z87.442

## 2015-12-29 HISTORY — DX: Benign prostatic hyperplasia without lower urinary tract symptoms: N40.0

## 2015-12-29 HISTORY — PX: CYSTOSCOPY WITH URETEROSCOPY AND STENT PLACEMENT: SHX6377

## 2015-12-29 SURGERY — CYSTOURETEROSCOPY, WITH STENT INSERTION
Anesthesia: General | Laterality: Right

## 2015-12-29 MED ORDER — ONDANSETRON HCL 4 MG/2ML IJ SOLN
INTRAMUSCULAR | Status: AC
  Filled 2015-12-29: qty 2

## 2015-12-29 MED ORDER — EPHEDRINE SULFATE 50 MG/ML IJ SOLN
INTRAMUSCULAR | Status: DC | PRN
  Administered 2015-12-29 (×2): 10 mg via INTRAVENOUS

## 2015-12-29 MED ORDER — IOHEXOL 350 MG/ML SOLN
INTRAVENOUS | Status: DC | PRN
  Administered 2015-12-29: 5 mL via URETHRAL

## 2015-12-29 MED ORDER — CIPROFLOXACIN IN D5W 400 MG/200ML IV SOLN
400.0000 mg | INTRAVENOUS | Status: AC
  Administered 2015-12-29: 400 mg via INTRAVENOUS
  Filled 2015-12-29: qty 200

## 2015-12-29 MED ORDER — CIPROFLOXACIN HCL 500 MG PO TABS: 500.0000 mg | ORAL_TABLET | Freq: Once | ORAL | Status: DC

## 2015-12-29 MED ORDER — FENTANYL CITRATE (PF) 100 MCG/2ML IJ SOLN
INTRAMUSCULAR | Status: DC | PRN
Start: 2015-12-29 — End: 2015-12-29
  Administered 2015-12-29: 50 ug via INTRAVENOUS

## 2015-12-29 MED ORDER — PROPOFOL 10 MG/ML IV BOLUS
INTRAVENOUS | Status: AC
  Filled 2015-12-29: qty 20

## 2015-12-29 MED ORDER — DEXAMETHASONE SODIUM PHOSPHATE 10 MG/ML IJ SOLN
INTRAMUSCULAR | Status: AC
Start: 2015-12-29 — End: 2015-12-29
  Filled 2015-12-29: qty 1

## 2015-12-29 MED ORDER — OXYCODONE HCL 5 MG PO TABS
5.0000 mg | ORAL_TABLET | Freq: Once | ORAL | Status: DC | PRN
  Filled 2015-12-29: qty 1

## 2015-12-29 MED ORDER — KETOROLAC TROMETHAMINE 30 MG/ML IJ SOLN
INTRAMUSCULAR | Status: DC | PRN
  Administered 2015-12-29: 15 mg via INTRAVENOUS

## 2015-12-29 MED ORDER — LIDOCAINE HCL (CARDIAC) 20 MG/ML IV SOLN
INTRAVENOUS | Status: DC | PRN
  Administered 2015-12-29: 70 mg via INTRAVENOUS

## 2015-12-29 MED ORDER — MEPERIDINE HCL 25 MG/ML IJ SOLN
6.2500 mg | INTRAMUSCULAR | Status: DC | PRN
  Filled 2015-12-29: qty 1

## 2015-12-29 MED ORDER — KETOROLAC TROMETHAMINE 30 MG/ML IJ SOLN
INTRAMUSCULAR | Status: AC
  Filled 2015-12-29: qty 1

## 2015-12-29 MED ORDER — SODIUM CHLORIDE 0.9 % IR SOLN
Status: DC | PRN
  Administered 2015-12-29 (×2): 1000 mL via INTRAVESICAL

## 2015-12-29 MED ORDER — FENTANYL CITRATE (PF) 100 MCG/2ML IJ SOLN
INTRAMUSCULAR | Status: AC
  Filled 2015-12-29: qty 2

## 2015-12-29 MED ORDER — LIDOCAINE HCL (CARDIAC) 20 MG/ML IV SOLN
INTRAVENOUS | Status: AC
  Filled 2015-12-29: qty 5

## 2015-12-29 MED ORDER — HYDROMORPHONE HCL 1 MG/ML IJ SOLN
0.2500 mg | INTRAMUSCULAR | Status: DC | PRN
  Filled 2015-12-29: qty 1

## 2015-12-29 MED ORDER — DEXAMETHASONE SODIUM PHOSPHATE 4 MG/ML IJ SOLN
INTRAMUSCULAR | Status: DC | PRN
  Administered 2015-12-29: 10 mg via INTRAVENOUS

## 2015-12-29 MED ORDER — LACTATED RINGERS IV SOLN
INTRAVENOUS | Status: DC
  Administered 2015-12-29 (×2): via INTRAVENOUS
  Filled 2015-12-29: qty 1000

## 2015-12-29 MED ORDER — ARTIFICIAL TEARS OP OINT
TOPICAL_OINTMENT | OPHTHALMIC | Status: AC
  Filled 2015-12-29: qty 3.5

## 2015-12-29 MED ORDER — OXYCODONE HCL 5 MG/5ML PO SOLN
5.0000 mg | Freq: Once | ORAL | Status: DC | PRN
  Filled 2015-12-29: qty 5

## 2015-12-29 MED ORDER — CIPROFLOXACIN IN D5W 400 MG/200ML IV SOLN
INTRAVENOUS | Status: AC
  Filled 2015-12-29: qty 200

## 2015-12-29 MED ORDER — PROPOFOL 10 MG/ML IV BOLUS
INTRAVENOUS | Status: DC | PRN
  Administered 2015-12-29: 180 mg via INTRAVENOUS

## 2015-12-29 MED ORDER — ONDANSETRON HCL 4 MG/2ML IJ SOLN
INTRAMUSCULAR | Status: DC | PRN
  Administered 2015-12-29: 4 mg via INTRAVENOUS

## 2015-12-29 MED ORDER — LIDOCAINE HCL 2 % EX GEL
CUTANEOUS | Status: DC | PRN
  Administered 2015-12-29: 1 via URETHRAL

## 2015-12-29 MED ORDER — EPHEDRINE SULFATE 50 MG/ML IJ SOLN
INTRAMUSCULAR | Status: AC
  Filled 2015-12-29: qty 1

## 2015-12-29 SURGICAL SUPPLY — 30 items
BAG DRAIN URO-CYSTO SKYTR STRL (DRAIN) ×2 IMPLANT
BASKET DAKOTA 1.9FR 11X120 (BASKET) IMPLANT
BASKET LASER NITINOL 1.9FR (BASKET) IMPLANT
BASKET STNLS GEMINI 4WIRE 3FR (BASKET) IMPLANT
BASKET STONE 1.7 NGAGE (UROLOGICAL SUPPLIES) ×2 IMPLANT
BASKET ZERO TIP NITINOL 2.4FR (BASKET) IMPLANT
CATH URET 5FR 28IN OPEN ENDED (CATHETERS) ×2 IMPLANT
CATH URET DUAL LUMEN 6-10FR 50 (CATHETERS) IMPLANT
CLOTH BEACON ORANGE TIMEOUT ST (SAFETY) ×2 IMPLANT
FIBER LASER TRAC TIP (UROLOGICAL SUPPLIES) IMPLANT
GLOVE BIO SURGEON STRL SZ7.5 (GLOVE) ×2 IMPLANT
GOWN STRL REUS W/ TWL XL LVL3 (GOWN DISPOSABLE) ×1 IMPLANT
GOWN STRL REUS W/TWL XL LVL3 (GOWN DISPOSABLE) ×1
GUIDEWIRE 0.038 PTFE COATED (WIRE) IMPLANT
GUIDEWIRE ANG ZIPWIRE 038X150 (WIRE) IMPLANT
GUIDEWIRE STR DUAL SENSOR (WIRE) ×2 IMPLANT
IV NS 1000ML (IV SOLUTION) ×2
IV NS 1000ML BAXH (IV SOLUTION) ×2 IMPLANT
KIT BALLIN UROMAX 15FX10 (LABEL) IMPLANT
KIT BALLN UROMAX 15FX4 (MISCELLANEOUS) IMPLANT
KIT BALLN UROMAX 26 75X4 (MISCELLANEOUS)
KIT ROOM TURNOVER WOR (KITS) ×2 IMPLANT
MANIFOLD NEPTUNE II (INSTRUMENTS) IMPLANT
NS IRRIG 500ML POUR BTL (IV SOLUTION) IMPLANT
PACK CYSTO (CUSTOM PROCEDURE TRAY) ×2 IMPLANT
SET HIGH PRES BAL DIL (LABEL)
SHEATH ACCESS URETERAL 38CM (SHEATH) ×2 IMPLANT
STENT URET 6FRX28 CONTOUR (STENTS) ×2 IMPLANT
TUBE CONNECTING 12X1/4 (SUCTIONS) ×2 IMPLANT
TUBE FEEDING 8FR 16IN STR KANG (MISCELLANEOUS) IMPLANT

## 2015-12-29 NOTE — Transfer of Care (Signed)
Last Vitals:  Filed Vitals:   12/29/15 0706 12/29/15 0949  BP: 168/96 186/84  Pulse: 64 69  Temp: 36.6 C 36.6 C  Resp: 16 16    Immediate Anesthesia Transfer of Care Note  Patient: Tyler Wade  Procedure(s) Performed: Procedure(s) (LRB): RIGHT  URETEROSCOPY; BASKET STONE REMOVAL AND STENT EXCHANGE; RIGHT RETROGRADE PYELOGRAM (Right)  Patient Location: PACU  Anesthesia Type: General  Level of Consciousness: awake, alert  and oriented  Airway & Oxygen Therapy: Patient Spontanous Breathing and Patient connected to nasal cannula oxygen  Post-op Assessment: Report given to PACU RN and Post -op Vital signs reviewed and stable  Post vital signs: Reviewed and stable  Complications: No apparent anesthesia complications

## 2015-12-29 NOTE — Discharge Instructions (Signed)
DISCHARGE INSTRUCTIONS FOR KIDNEY STONE/URETERAL STENT   MEDICATIONS:  1.  Resume all your other meds from home - except do not take any extra narcotic pain meds that you may have at home.  2.  Take Cipro one hour prior to removal of your stent.   ACTIVITY:  1. No strenuous activity x 1week  2. No driving while on narcotic pain medications  3. Drink plenty of water  4. Continue to walk at home - you can still get blood clots when you are at home, so keep active, but don't over do it.  5. May return to work/school tomorrow or when you feel ready   BATHING:  1. You can shower and we recommend daily showers  2. You have a string coming from your urethra: The stent string is attached to your ureteral stent. Do not pull on this.   SIGNS/SYMPTOMS TO CALL:  Please call us if you have a fever greater than 101.5, uncontrolled nausea/vomiting, uncontrolled pain, dizziness, unable to urinate, bloody urine, chest pain, shortness of breath, leg swelling, leg pain, redness around wound, drainage from wound, or any other concerns or questions.   You can reach us at [phone].   FOLLOW-UP:  1. You have an appointment in 6 weeks with a ultrasound of your kidneys prior.   2. You have a string attached to your stent, you may remove it on Monday morning, 01/03/16. To do this, pull the string until the stents are completely removed. You may feel an odd sensation in your back.     Post Anesthesia Home Care Instructions  Activity: Get plenty of rest for the remainder of the day. A responsible adult should stay with you for 24 hours following the procedure.  For the next 24 hours, DO NOT: -Drive a car -Advertising copywriterperate machinery -Drink alcoholic beverages -Take any medication unless instructed by your physician -Make any legal decisions or sign important papers.  Meals: Start with liquid foods such as gelatin or soup. Progress to regular foods as tolerated. Avoid greasy, spicy, heavy foods. If nausea  and/or vomiting occur, drink only clear liquids until the nausea and/or vomiting subsides. Call your physician if vomiting continues.  Special Instructions/Symptoms: Your throat may feel dry or sore from the anesthesia or the breathing tube placed in your throat during surgery. If this causes discomfort, gargle with warm salt water. The discomfort should disappear within 24 hours.  If you had a scopolamine patch placed behind your ear for the management of post- operative nausea and/or vomiting:  1. The medication in the patch is effective for 72 hours, after which it should be removed.  Wrap patch in a tissue and discard in the trash. Wash hands thoroughly with soap and water. 2. You may remove the patch earlier than 72 hours if you experience unpleasant side effects which may include dry mouth, dizziness or visual disturbances. 3. Avoid touching the patch. Wash your hands with soap and water after contact with the patch.

## 2015-12-29 NOTE — Op Note (Signed)
Preoperative diagnosis: right renalcalculus  Postoperative diagnosis: right renal calculus  Procedure:  1. Cystoscopy 2. right ureteroscopy and stone removal 3. right 41F x 28cm ureteral stent placement  4. right retrograde pyelography with interpretation  Surgeon: Crist FatBenjamin W. Herrick, MD  Anesthesia: General  Complications: None  Intraoperative findings: right retrograde pyelography demonstrated a no filling defect within the right ureter or calyces.   EBL: Minimal  Specimens: 1. Right renal calculus  Disposition of specimens: [facility] for stone analysis  Indication: Tyler Wade is a 75 y.o.   patient with urolithiasis. After reviewing the management options for treatment, the patient elected to proceed with the above surgical procedure(s). We have discussed the potential benefits and risks of the procedure, side effects of the proposed treatment, the likelihood of the patient achieving the goals of the procedure, and any potential problems that might occur during the procedure or recuperation. Informed consent has been obtained.  Description of procedure:  The patient was taken to the operating room and general anesthesia was induced.  The patient was placed in the dorsal lithotomy position, prepped and draped in the usual sterile fashion, and preoperative antibiotics were administered. A preoperative time-out was performed.   Cystourethroscopy was performed.  The patient's urethra was examined and was normal with bilobar prostatic hypertrophy.  The bladder was then systematically examined in its entirety. There was no evidence for any bladder tumors, stones, or other mucosal pathology.    Attention was then turned to the right ureteral orifice where there was a double-J stent emanating, using the stay graspers I grasped the tip of the stent and pulled to the urethral meatus. I then advanced a 0.38 sensor wire through the stent and up into the right renal  pelvis. Stent was then removed over the wire. I then started with the flexible ureteroscope and advanced through the urethra and into the right ureter under visual guidance. I had a difficult time passing the distal ureter but did note a few stone fragments which I was able to basket and pull into the bladder. At this point, I opted to perform rigid ureteroscopy and removed the flexible scope. Using the rigid ureteroscope I was able to get beyond the narrowed distal ureter and into the proximal ureter without any additional stone fragments or mucosal/ureteral abnormalities. I advanced a second PTFE wire through the ureteroscope and into the right renal pelvis. I then advanced a 12/14 [place] times 38 cm ureteral access sheath over the PTFE wire and into the mid right ureter. This was done under fluoroscopic guidance. I then removed the inner portion of the sheath and inserted the flexible scope through the access sheath and advanced it into the right renal pelvis.  I then encountered numerous very small stone fragments in the lower pole which I was able to grasp with a N-gage basket and removed. There is no fragmentation required. Once all the stone fragments had been removed I performed pyeloscopy using fluoroscopic guidance to ensure that all calyces had been inspected. There were no additional stone fragments remaining. I then removed the access sheath under visual guidance I will tenuously removing the scope noting no significant damage and to the right ureter.  The wire was then backloaded through the cystoscope and a ureteral stent was advance over the wire using Seldinger technique.  The stent was positioned appropriately under fluoroscopic and cystoscopic guidance.  The wire was then removed with an adequate stent curl noted in the renal pelvis as well as  in the bladder.  The bladder was then emptied and the procedure ended.  The patient appeared to tolerate the procedure well and without complications.   The patient was able to be awakened and transferred to the recovery unit in satisfactory condition.   Disposition: The tether of the stent was left on and secured to the ventral aspect of the patient's penis. Instructions for removing the stent have been provided to the patient. This has been scheduled for followup in 6 weeks with a renal ultrasound.

## 2015-12-29 NOTE — Interval H&P Note (Signed)
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  Plan is to removal the residual stones in the his right lower pole.  No change to the patient's history and physical.  12/29/2015 5:23 AM  Cyndy Freezeobert I Harty  has presented today for surgery, with the diagnosis of RIGHT KIDNEY STONE   The various methods of treatment have been discussed with the patient and family. After consideration of risks, benefits and other options for treatment, the patient has consented to  Procedure(s): RIGHT  URETEROSCOPY STONE REMOVAL AND STENT EXCHANGED  (Right) HOLMIUM LASER LITHOTRIPSY  (Right) as a surgical intervention .  The patient's history has been reviewed, patient examined, no change in status, stable for surgery.  I have reviewed the patient's chart and labs.  Questions were answered to the patient's satisfaction.     Berniece SalinesHERRICK, Caellum Mancil W

## 2015-12-29 NOTE — H&P (View-Only) (Signed)
Reason For Visit Right partial staghorn   History of Present Illness 30M presented ~822months ago for microscopic hematuria. He was found to have a large right UPJ stone with stones in the lower pole. He opted to follow-up in 3 months. He has a history of nephrolithiasis in 411980s. He has moderate obstructive voiding symptoms.  BUN/Cr 17/1.1 on 07/12/15. PSA 1.16. The patient has no past medical history of any significance. He is not taking any medications. 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 He has not had any ongoing flank pain, hematuria, fevers or chills, or voiding symptoms.   Past Medical History Problems  1. History of kidney stones (Z87.442) 2. History of No significant past medical history  Surgical History Problems  1. History of Tonsillectomy  Current Meds 1. MiraLax Oral Powder;  Therapy: (Recorded:21Nov2016) to Recorded 2. PreserVision AREDS CAPS;  Therapy: (Recorded:21Nov2016) to Recorded  Allergies Medication  1. No Known Drug Allergies  Family History Problems  1. Family history of Death of family member : Mother, Father   Mother at age 75; childbirthFather at age 58100; old age  Social History Problems  1. Denied: History of Alcohol use 2. Caffeine use (F15.90)   1- tea and cola 3. Former smoker (Z87.891)   < 1ppd; smoked for 5 years and quit 51 years ago 4. Married 5. Number of children   1 son and 1 daughter 876. Retired  Review of Systems Patient denies any dyspnea on exertion, chest pain, or shortness of breath.   Vitals Vital Signs [Data Includes: Last 1 Day]  Recorded: 06Feb2017 08:18AM  Blood Pressure: 177 / 77 Heart Rate: 67  Physical Exam Constitutional: Well nourished and well developed . No acute distress.  ENT:. The ears and nose are normal in appearance.  Neck: The appearance of the neck is normal and no neck mass is present.  Pulmonary: No respiratory distress and  normal respiratory rhythm and effort.  Cardiovascular: Heart rate and rhythm are normal . No peripheral edema.  Abdomen: The abdomen is soft and nontender. No masses are palpated. No CVA tenderness. No hernias are palpable. No hepatosplenomegaly noted.  Neuro/Psych:. Mood and affect are appropriate.    Results/Data Urine [Data Includes: Last 1 Day]   06Feb2017  COLOR YELLOW   APPEARANCE CLEAR   SPECIFIC GRAVITY 1.010   pH 6.0   GLUCOSE NEGATIVE   BILIRUBIN NEGATIVE   KETONE NEGATIVE   BLOOD NEGATIVE   PROTEIN NEGATIVE   NITRITE NEGATIVE   LEUKOCYTE ESTERASE NEGATIVE    Patient's urinalysis today is clear.  KUB: Was obtained today in clinic to evaluate the patient's stone burden. The stones are clearly visible in the right renal pelvis and lower pole. The largest stone measures 2.8 cm long and approximately 1.8 cm wide. There are no other stones along the expected trajectory of the right ureter. The left collecting system is clear. The gas pattern is grossly normal. There are no significant or obvious bony abnormalities.   Plan Asymptomatic microscopic hematuria  1. Follow-up Schedule Surgery Office  Follow-up  Status: Hold For - Appointment   Requested for: 06Feb2017 2. URINE CULTURE; Status:In Progress - Specimen/Data Collected;   Done: 06Feb2017 Health Maintenance  3. UA With REFLEX; [Do Not Release]; Status:Complete;   Done: 06Feb2017 07:57AM  Discussion/Summary The patient has a right partial staghorn calculi. We discussed treatment options. Ultimately, I recommended the patient consider a right percutaneous nephrolithotomy. I discussed the  procedure the patient PCNL, with surgeon access, and the risks associated with the surgery. The patient understands that there is a risk of bleeding and injury to the surrounding structures. He also understands the chance of a second procedure, likely ureteroscopy. The patient also understands that he will have a ureteral stent placed at the  time surgery and that he will need a nephrostomy tube. I discussed the hospitalization and the expected recovery with the patient. The patient has agreed to proceed, we'll try to get this scheduled at his convenience. Given the patient's good health, I don't think he needs to be cleared preoperatively by his primary care doctor.

## 2015-12-29 NOTE — Anesthesia Postprocedure Evaluation (Signed)
Anesthesia Post Note  Patient: Cyndy FreezeRobert I Gosselin  Procedure(s) Performed: Procedure(s) (LRB): RIGHT  URETEROSCOPY; BASKET STONE REMOVAL AND STENT EXCHANGE; RIGHT RETROGRADE PYELOGRAM (Right)  Patient location during evaluation: PACU Anesthesia Type: General Level of consciousness: awake and alert Pain management: pain level controlled Vital Signs Assessment: post-procedure vital signs reviewed and stable Respiratory status: spontaneous breathing, nonlabored ventilation and respiratory function stable Cardiovascular status: blood pressure returned to baseline and stable Postop Assessment: no signs of nausea or vomiting Anesthetic complications: no    Last Vitals:  Filed Vitals:   12/29/15 1000 12/29/15 1004  BP: 165/75   Pulse: 71 72  Temp:    Resp: 12 15    Last Pain: There were no vitals filed for this visit.               Mickey Esguerra A

## 2015-12-29 NOTE — Addendum Note (Signed)
Addendum  created 12/29/15 1037 by Norva Pavlovobin G Karmello Abercrombie, CRNA   Modules edited: Anesthesia Flowsheet

## 2015-12-29 NOTE — Anesthesia Preprocedure Evaluation (Addendum)
Anesthesia Evaluation  Patient identified by MRN, date of birth, ID band Patient awake    Reviewed: Allergy & Precautions, NPO status , Patient's Chart, lab work & pertinent test results  Airway Mallampati: I  TM Distance: >3 FB Neck ROM: Full    Dental  (+) Teeth Intact, Dental Advisory Given   Pulmonary former smoker,    breath sounds clear to auscultation       Cardiovascular  Rhythm:Regular Rate:Normal     Neuro/Psych    GI/Hepatic   Endo/Other    Renal/GU Renal diseaseNephrolithiasis     Musculoskeletal   Abdominal   Peds  Hematology   Anesthesia Other Findings   Reproductive/Obstetrics                            Anesthesia Physical Anesthesia Plan  ASA: II  Anesthesia Plan: General   Post-op Pain Management:    Induction: Intravenous  Airway Management Planned: LMA  Additional Equipment:   Intra-op Plan:   Post-operative Plan: Extubation in OR  Informed Consent: I have reviewed the patients History and Physical, chart, labs and discussed the procedure including the risks, benefits and alternatives for the proposed anesthesia with the patient or authorized representative who has indicated his/her understanding and acceptance.   Dental advisory given  Plan Discussed with: CRNA, Surgeon and Anesthesiologist  Anesthesia Plan Comments:         Anesthesia Quick Evaluation

## 2015-12-29 NOTE — Anesthesia Procedure Notes (Signed)
Procedure Name: LMA Insertion Date/Time: 12/29/2015 8:42 AM Performed by: Norva PavlovALLAWAY, Coila Wardell G Pre-anesthesia Checklist: Patient identified, Emergency Drugs available, Suction available and Patient being monitored Patient Re-evaluated:Patient Re-evaluated prior to inductionOxygen Delivery Method: Circle System Utilized Preoxygenation: Pre-oxygenation with 100% oxygen Intubation Type: IV induction Ventilation: Mask ventilation without difficulty LMA: LMA inserted LMA Size: 4.0 Number of attempts: 1 Airway Equipment and Method: bite block Placement Confirmation: positive ETCO2 Tube secured with: Tape Dental Injury: Teeth and Oropharynx as per pre-operative assessment

## 2015-12-30 ENCOUNTER — Encounter (HOSPITAL_BASED_OUTPATIENT_CLINIC_OR_DEPARTMENT_OTHER): Payer: Self-pay | Admitting: Urology

## 2016-06-07 ENCOUNTER — Encounter: Payer: Self-pay | Admitting: Internal Medicine

## 2017-12-02 IMAGING — CT CT ABD-PELV W/O CM
2 of 3 series · 15 of 42 positions shown, 17 images · non-contrast
Comparison: CT the abdomen and pelvis 10/04/2015.

CLINICAL DATA: 74-year-old male status post stone removal
yesterday. Evaluate for residual stone fragments. Hematuria.

EXAM:
CT ABDOMEN AND PELVIS WITHOUT CONTRAST
TECHNIQUE: Multidetector CT imaging of the abdomen and pelvis was performed
following the standard protocol without IV contrast.

[Series 4: lung windows · axial · 0.86mm/px · z∈[-176,-61]mm · 12 of 28 slices shown, 14 images]
[im 3/28  soft-tissue]
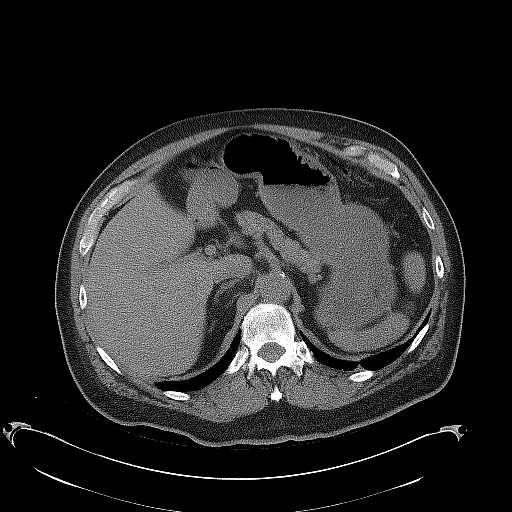
[im 3/28  bone]
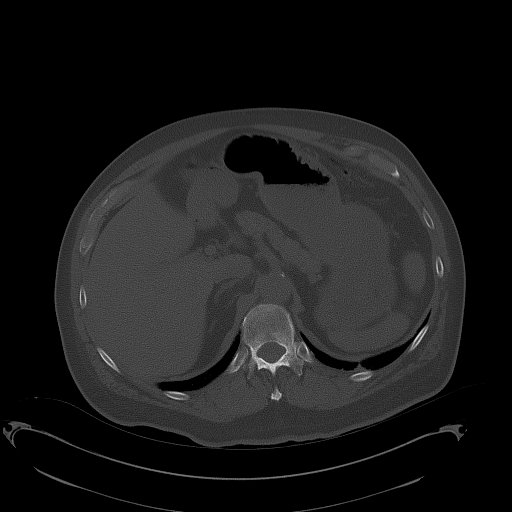
[im 5/28  soft-tissue]
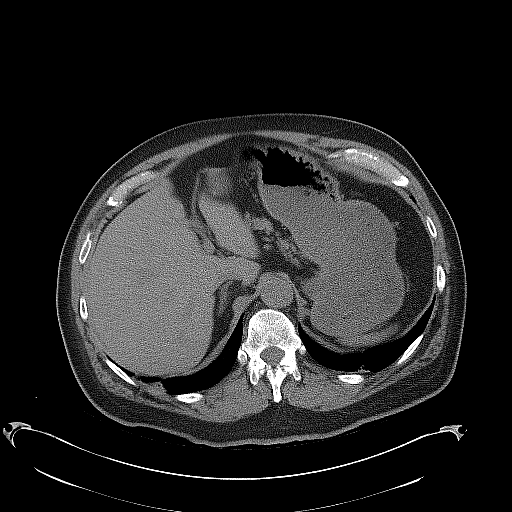
[im 7/28  soft-tissue]
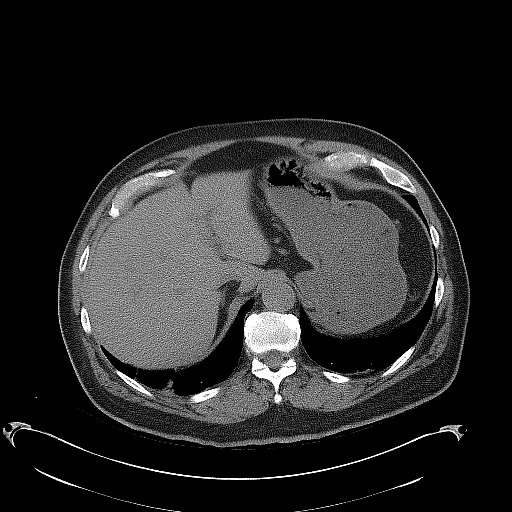
[im 9/28  soft-tissue]
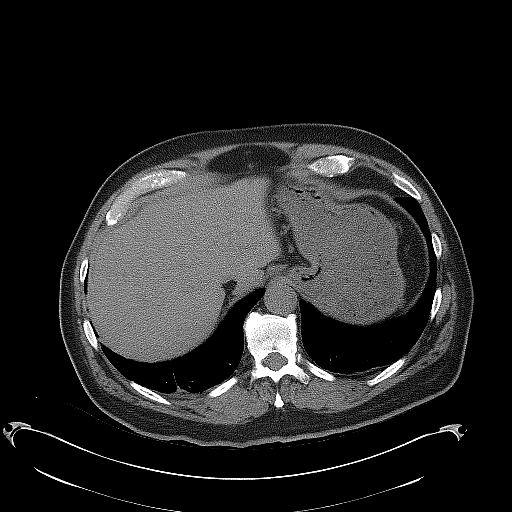
[im 11/28  soft-tissue]
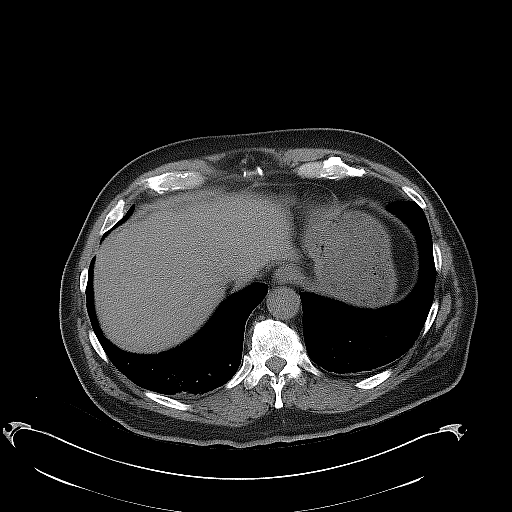
[im 13/28  soft-tissue]
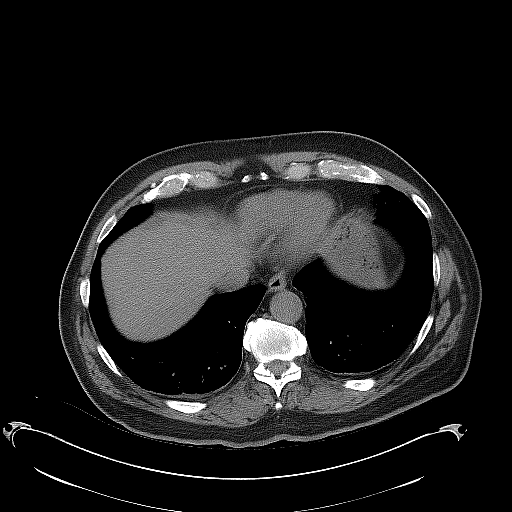
[im 16/28  soft-tissue]
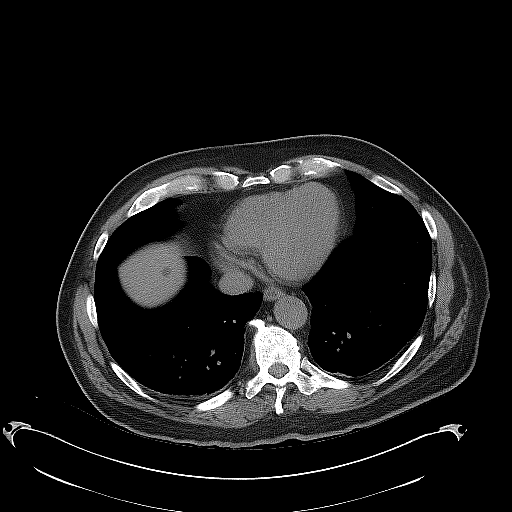
[im 18/28  soft-tissue]
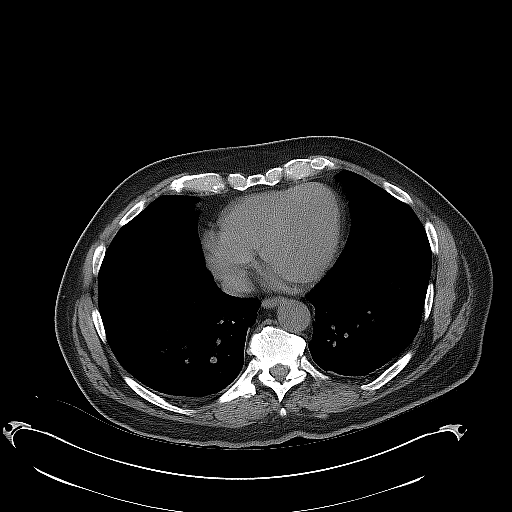
[im 20/28  soft-tissue]
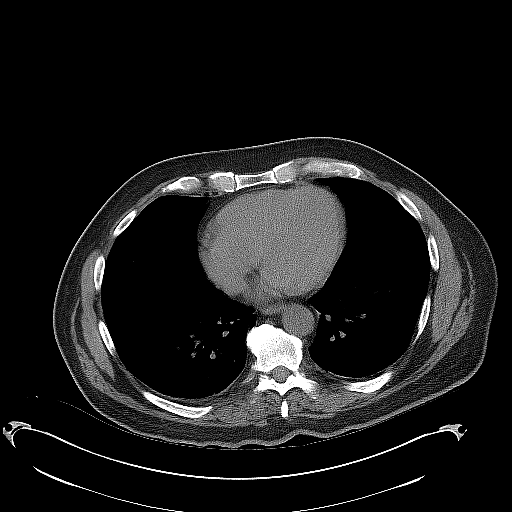
[im 20/28  bone]
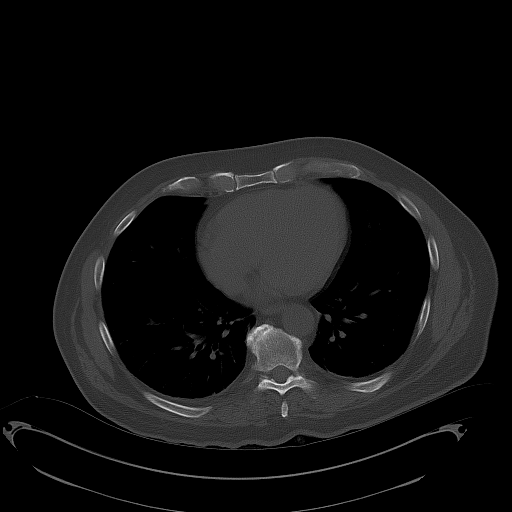
[im 22/28  soft-tissue]
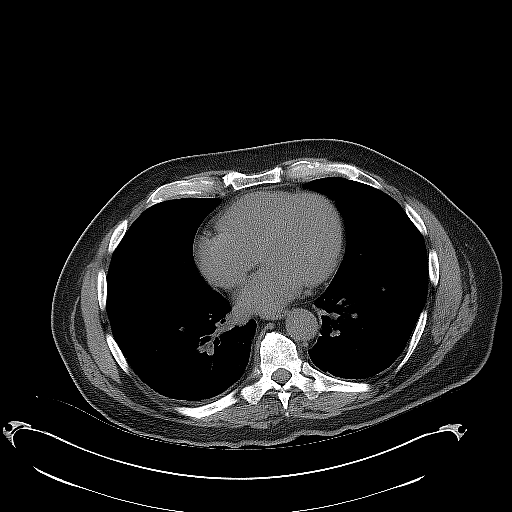
[im 24/28  soft-tissue]
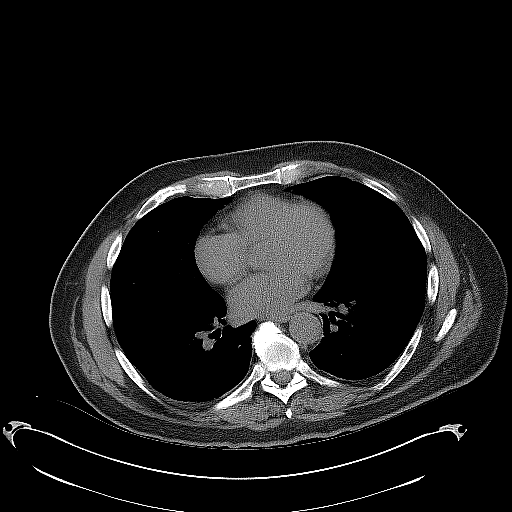
[im 26/28  soft-tissue]
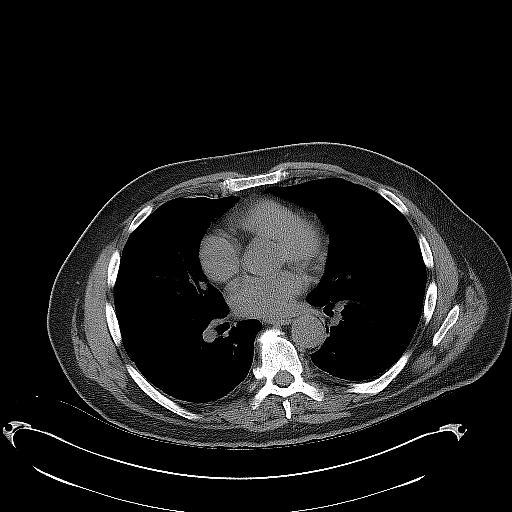

[Series 602: coronal images · coronal · 0.95mm/px · 3 of 97 slices shown]
[im 33/97  soft-tissue]
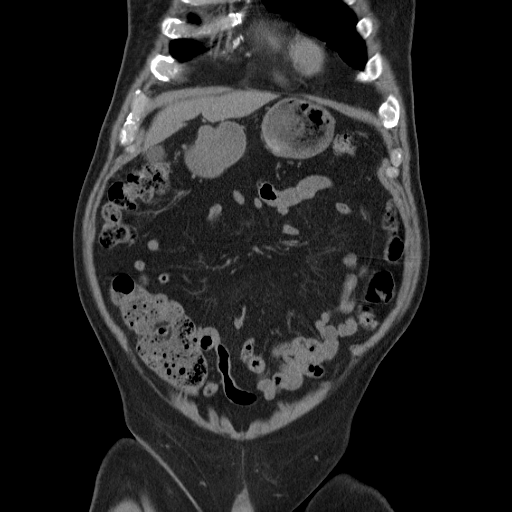
[im 43/97  soft-tissue]
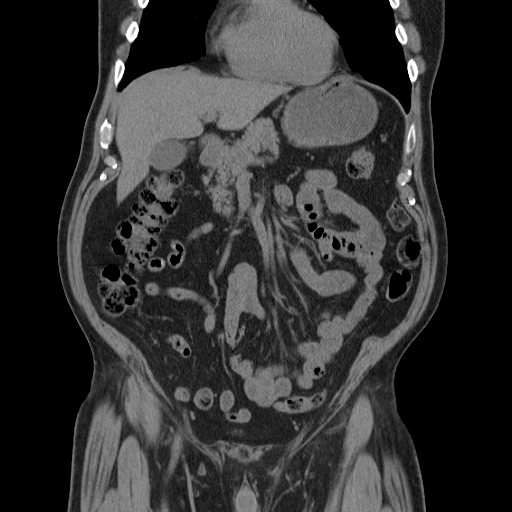
[im 54/97  soft-tissue]
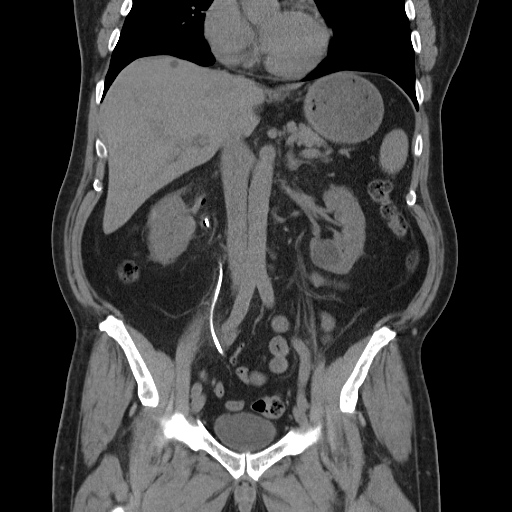

[15 of 42 positions shown; findings below may reference images not displayed]

FINDINGS: Lower chest:  Mild scarring in the lung bases bilaterally.

Hepatobiliary: 9 mm low-attenuation lesion in the superior aspect of
segment 8 of the liver is incompletely characterized on today's
noncontrast CT examination, but is similar to prior study,
presumably a small cyst. No other suspicious hepatic lesion
identified on today's noncontrast CT examination. The unenhanced
appearance of the gallbladder is normal.

Pancreas: No pancreatic mass or peripancreatic inflammatory changes
on today's noncontrast CT examination.

Spleen: Unremarkable.

Adrenals/Urinary Tract: In the lower pole collecting system of the
right kidney there are some residual stones or stone fragments,
measuring up to 6 mm. The number of stones in the right renal
collecting system has significantly decreased compared to the prior
study, and a large stone in the renal pelvis seen on the prior study
is no longer evident. There has been interval placement of a
right-sided double-J ureteral stent, proximal loop is properly
reformed in the right renal pelvis while the distal loop is properly
reformed in the lumen of the urinary bladder. No additional stone
fragments are identified along the course of the right ureter or
within the lumen of the urinary bladder. No left-sided calculi or
left ureteral calculi are noted. No hydroureteronephrosis. There is
a small amount of perinephric stranding. Additionally, there is a
very small crescentic high attenuation fluid collection associated
with the lower pole of the right kidney (image 49 of series 2),
compatible with a tiny subcapsular hemorrhage. A small amount of
this is also noted adjacent to the anterior aspect of the interpolar
region (image 42 of series 2). 3 cm low-attenuation lesion in the
upper pole of the right kidney intimately associated with the right
renal collecting system, presumably a parapelvic cyst. 3.2 cm
low-attenuation lesion in the lower pole of the left kidney,
compatible with a cyst, similar to prior examination. Small amount
of gas non dependently in the lumen of the urinary bladder,
presumably iatrogenic. Bilateral adrenal glands are normal in
appearance.

Stomach/Bowel: Unenhanced appearance of the stomach is normal. No
pathologic dilatation of small bowel or colon.

Vascular/Lymphatic: Mild atherosclerotic calcifications noted in the
abdominal and pelvic vasculature, without definite aneurysm. No
lymphadenopathy noted in the abdomen or pelvis on today's
noncontrast CT examination.

Reproductive: Prostate gland seminal vesicles are unremarkable in
appearance.

Other: No significant volume of ascites.  No pneumoperitoneum.

Musculoskeletal: There are no aggressive appearing lytic or blastic
lesions noted in the visualized portions of the skeleton.
IMPRESSION: 1. Significant decrease in stone burden in the right renal
collecting system, with only a few small stones or stone fragments
remaining in the lower pole, as above.
2. Tiny amount of postprocedural subcapsular hemorrhage associated
with the interpolar and lower pole region of the right kidney. This
is of doubtful clinical significance.
3. Interval placement of right-sided double-J ureteral stent. No
hydroureteronephrosis at this time.
4. Atherosclerosis.
5. Additional incidental findings, as above.

## 2018-07-13 LAB — AMB EXT HGBA1C
Hemoglobin A1C, External: 12.7 NA
Hemoglobin A1c, External: 12.7

## 2018-07-13 LAB — AMB EXT CREATININE
Creatinine, External: 2.28
Creatinine, External: 2.28 NA

## 2018-07-13 LAB — AMB EXT LDL-C
LDL-C, External: 69
LDL-C, External: 69 NA

## 2018-08-19 ENCOUNTER — Encounter: Attending: "Endocrinology | Primary: Family

## 2018-08-19 NOTE — Progress Notes (Deleted)
Daniel Arellano is a 77 y.o. male here for No chief complaint on file.      Functional glucose monitor and record keeping system? -   Eye exam within last year? -   Foot exam within last year? - due    1. Have you been to the ER, urgent care clinic since your last visit?  Hospitalized since your last visit? -n/a    2. Have you seen or consulted any other health care providers outside of the [facility] since your last visit?  Include any pap smears or colon screening.-n/a

## 2018-10-07 ENCOUNTER — Ambulatory Visit: Attending: "Endocrinology | Primary: Family

## 2018-10-07 ENCOUNTER — Ambulatory Visit
Admit: 2018-10-07 | Discharge: 2018-10-07 | Payer: PRIVATE HEALTH INSURANCE | Attending: "Endocrinology | Primary: Family

## 2018-10-07 DIAGNOSIS — E1165 Type 2 diabetes mellitus with hyperglycemia: Secondary | ICD-10-CM

## 2018-10-07 MED ORDER — BLOOD GLUCOSE METER KIT
PACK | 0 refills | Status: DC
Start: 2018-10-07 — End: 2021-06-02

## 2018-10-07 MED ORDER — BLOOD SUGAR DIAGNOSTIC TEST STRIPS
ORAL_STRIP | 4 refills | Status: DC
Start: 2018-10-07 — End: 2021-06-02

## 2018-10-07 MED ORDER — INSULIN ASPART 100 UNIT/ML (3 ML) SUB-Q PEN
100 unit/mL (3 mL) | Freq: Two times a day (BID) | SUBCUTANEOUS | 5 refills | Status: DC
Start: 2018-10-07 — End: 2021-06-02

## 2018-10-07 MED ORDER — INSULIN DETEMIR 100 UNIT/ML INJECTION
100 unit/mL | Freq: Every evening | SUBCUTANEOUS | 4 refills | Status: AC
Start: 2018-10-07 — End: ?

## 2018-10-07 NOTE — Progress Notes (Signed)
Room 1    Identified pt with two pt identifiers(name and DOB). Reviewed record in preparation for visit and have obtained necessary documentation. All patient medications has been reviewed.  Chief Complaint   Patient presents with   ??? Establish Care   ??? Diabetes     Learning Assessment 10/07/2018   PRIMARY LEARNER Patient   BARRIERS PRIMARY LEARNER NONE   CO-LEARNER CAREGIVER No   PRIMARY LANGUAGE ENGLISH   INTERPRETER NEED No   LEARNER PREFERENCE PRIMARY OTHER (COMMENT)   LEARNING SPECIAL TOPICS none   ANSWERED BY patient   RELATIONSHIP SELF       Health Maintenance Due   Topic   ??? HEMOGLOBIN A1C Q6M    ??? LIPID PANEL Q1    ??? FOOT EXAM Q1    ??? MICROALBUMIN Q1    ??? EYE EXAM RETINAL OR DILATED    ??? DTaP/Tdap/Td series (1 - Tdap)   ??? Shingrix Vaccine Age 50> (1 of 2)   ??? GLAUCOMA SCREENING Q2Y        Vitals:    10/07/18 1249 10/07/18 1257   BP: (!) 210/103 (!) 205/110   Pulse: 60    Resp: 18    Temp: 97.7 ??F (36.5 ??C)    TempSrc: Oral    SpO2: 99%    Weight: 183 lb 9.6 oz (83.3 kg)    Height: 5' 8.7" (1.745 m)        Wt Readings from Last 3 Encounters:   10/07/18 183 lb 9.6 oz (83.3 kg)     Temp Readings from Last 3 Encounters:   10/07/18 97.7 ??F (36.5 ??C) (Oral)     BP Readings from Last 3 Encounters:   10/07/18 (!) 205/110     Pulse Readings from Last 3 Encounters:   10/07/18 60       No results found for: HBA1C, HGBE8, HBA1CPOC, HBA1CEXT, HBA1CEXT    Coordination of Care Questionnaire:   1) Have you been to an emergency room, urgent care, or hospitalized since your last visit?   no       2. Have seen or consulted any other health care provider since your last visit? NO    3) Do you have an Advanced Directive/ Living Will in place? NO  If yes, do we have a copy on file NO  If no, would you like information NO    Patient is accompanied by self I have received verbal consent from Daniel Arellano to discuss any/all medical information while they are present in the room.

## 2018-10-07 NOTE — Patient Instructions (Signed)
Preventing Falls: Care Instructions  Your Care Instructions    Getting around your home safely can be a challenge if you have injuries or health problems that make it easy for you to fall. Loose rugs and furniture in walkways are among the dangers for many older people who have problems walking or who have poor eyesight. People who have conditions such as arthritis, osteoporosis, or dementia also have to be careful not to fall.  You can make your home safer with a few simple measures.  Follow-up care is a key part of your treatment and safety. Be sure to make and go to all appointments, and call your doctor if you are having problems. It's also a good idea to know your test results and keep a list of the medicines you take.  How can you care for yourself at home?  Taking care of yourself  ?? You may get dizzy if you do not drink enough water. To prevent dehydration, drink plenty of fluids, enough so that your urine is light yellow or clear like water. Choose water and other caffeine-free clear liquids. If you have kidney, heart, or liver disease and have to limit fluids, talk with your doctor before you increase the amount of fluids you drink.  ?? Exercise regularly to improve your strength, muscle tone, and balance. Walk if you can. Swimming may be a good choice if you cannot walk easily.  ?? Have your vision and hearing checked each year or any time you notice a change. If you have trouble seeing and hearing, you might not be able to avoid objects and could lose your balance.  ?? Know the side effects of the medicines you take. Ask your doctor or pharmacist whether the medicines you take can affect your balance. Sleeping pills or sedatives can affect your balance.  ?? Limit the amount of alcohol you drink. Alcohol can impair your balance and other senses.  ?? Ask your doctor whether calluses or corns on your feet need to be removed. If you wear loose-fitting shoes because of calluses or corns, you  can lose your balance and fall.  ?? Talk to your doctor if you have numbness in your feet.  Preventing falls at home  ?? Remove raised doorway thresholds, throw rugs, and clutter. Repair loose carpet or raised areas in the floor.  ?? Move furniture and electrical cords to keep them out of walking paths.  ?? Use nonskid floor wax, and wipe up spills right away, especially on ceramic tile floors.  ?? If you use a walker or cane, put rubber tips on it. If you use crutches, clean the bottoms of them regularly with an abrasive pad, such as steel wool.  ?? Keep your house well lit, especially stairways, porches, and outside walkways. Use night-lights in areas such as hallways and bathrooms. Add extra light switches or use remote switches (such as switches that go on or off when you clap your hands) to make it easier to turn lights on if you have to get up during the night.  ?? Install sturdy handrails on stairways.  ?? Move items in your cabinets so that the things you use a lot are on the lower shelves (about waist level).  ?? Keep a cordless phone and a flashlight with new batteries by your bed. If possible, put a phone in each of the main rooms of your house, or carry a cell phone in case you fall and cannot reach a phone. Or, you can   wear a device around your neck or wrist. You push a button that sends a signal for help.  ?? Wear low-heeled shoes that fit well and give your feet good support. Use footwear with nonskid soles. Check the heels and soles of your shoes for wear. Repair or replace worn heels or soles.  ?? Do not wear socks without shoes on wood floors.  ?? Walk on the grass when the sidewalks are slippery. If you live in an area that gets snow and ice in the winter, sprinkle salt on slippery steps and sidewalks.  Preventing falls in the bath  ?? Install grab bars and nonskid mats inside and outside your shower or tub and near the toilet and sinks.  ?? Use shower chairs and bath benches.   ?? Use a hand-held shower head that will allow you to sit while showering.  ?? Get into a tub or shower by putting the weaker leg in first. Get out of a tub or shower with your strong side first.  ?? Repair loose toilet seats and consider installing a raised toilet seat to make getting on and off the toilet easier.  ?? Keep your bathroom door unlocked while you are in the shower.  Where can you learn more?  Go to http://www.healthwise.net/[employer].  Enter G117 in the search box to learn more about "Preventing Falls: Care Instructions."  Current as of: August 22, 2017  Content Version: 12.2  ?? 2006-2019 [employer]. Care instructions adapted under license by [employer] (which disclaims liability or warranty for this information). If you have questions about a medical condition or this instruction, always ask your healthcare professional. Healthwise, Inc[employer] disclaims any warranty or liability for your use of this information.

## 2018-10-07 NOTE — Addendum Note (Signed)
Addended by: Debe CoderMUTHYALA, Rissa Turley DEVI on: 10/08/2018 03:34 PM     Modules accepted: Orders

## 2018-10-07 NOTE — Progress Notes (Signed)
[facility]               Thane Edu ,MD          Patient Information  Date:10/08/2018  Name : Daniel Arellano 77 y.o.     D.O.B : [birth year]         Referred by: Ardean Larsen, NP         Chief Complaint   Patient presents with   ??? Establish Care   ??? Diabetes       History of Present Illness: Daniel Arellano is a 77 y.o. male here for initial visit   Type 2 diabetes diagnosed 20 years ago, complicated by nephropathy  He is supposed to be on Levemir, NovoLog but not taking it consistently  He is checking the blood glucose intermittently.  He did not take insulin this morning   walks for 10 minutes a day   He also did not take the blood pressure medication today.  Blood pressure is high  No chest pain, shortness of breath, blurred vision  History of CVA, CAD however patient denies having CAD    No hypoglycemia  Diet reportedly is healthy  He also has underlying hypertension, hyperlipidemia    Wt Readings from Last 3 Encounters:   10/07/18 183 lb 9.6 oz (83.3 kg)       BP Readings from Last 3 Encounters:   10/07/18 (!) 205/110           Past Medical History:   Diagnosis Date   ??? Chronic kidney disease, stage 3 ([place])    ??? Coronary artery disease involving native coronary artery of native heart without angina pectoris    ??? CVA (cerebral vascular accident) ([place])    ??? Essential hypertension    ??? Hyperlipidemia    ??? Sequela of cerebrovascular accident    ??? Type 2 diabetes mellitus (HCC)      Current Outpatient Medications   Medication Sig   ??? amLODIPine (NORVASC) 10 mg tablet Take  by mouth daily.   ??? clopidogrel (PLAVIX) 75 mg tab Take  by mouth.   ??? alcohol swabs (ALCOHOL PREP PADS) padm by Apply Externally route.   ??? aspirin delayed-release 81 mg tablet Take  by mouth daily.   ??? atorvastatin (LIPITOR) 40 mg tablet Take  by mouth daily.   ??? lisinopril (PRINIVIL, ZESTRIL) 20 mg tablet Take  by mouth daily.   ??? furosemide (LASIX) 40 mg tablet Take  by mouth daily.    ??? metoprolol succinate (TOPROL-XL) 100 mg tablet Take  by mouth daily.   ??? hydrALAZINE (APRESOLINE) 100 mg tablet Take 100 mg by mouth three (3) times daily.   ??? insulin aspart U-100 (NOVOLOG FLEXPEN U-100 INSULIN) 100 unit/mL (3 mL) inpn 4 Units by SubCUTAneous route Before breakfast and dinner.   ??? Blood-Glucose Meter (FREESTYLE LITE METER) monitoring kit To test sugars   ??? glucose blood VI test strips (FREESTYLE LITE STRIPS) strip Tests three times aday   ??? insulin detemir U-100 (LEVEMIR U-100 INSULIN) 100 unit/mL injection 15 Units by SubCUTAneous route nightly.     No current facility-administered medications for this visit.      No Known Allergies    Review of Systems:  All 10 systems reviewed and are negative other than mentioned in HPI    Physical Examination:   Blood pressure (!) 205/110, pulse 60, temperature 97.7 ??F (36.5 ??C), temperature source Oral, resp. rate 18, height 5' 8.7" (1.745  m), weight 183 lb 9.6 oz (83.3 kg), SpO2 99 %. Estimated body mass index is 27.35 kg/m?? as calculated from the following:    Height as of this encounter: 5' 8.7" (1.745 m).  -   Weight as of this encounter: 183 lb 9.6 oz (83.3 kg).  - General: pleasant, no distress, good eye contact  - HEENT: no pallor, no periorbital edema, EOMI  - Neck: supple, no thyromegaly,  - Cardiovascular: regular,  normal S1 and S2,  - Respiratory: clear to auscultation bilaterally  - Gastrointestinal: soft, nontender, nondistended,  BS +  - Musculoskeletal: no proximal muscle weakness in upper or lower extremities  - Integumentary: no acanthosis nigricans,no edema,   - Neurological: alert and oriented  - Psychiatric: normal mood and affect  - Skin: color, texture, turgor normal.     Diabetic foot exam: December 2019    Left:     Vibratory sensation absent   Filament test decreased sensation with micro filament   Pulse DP: 1+    Deformities: Callus  Right:    Vibratory sensation absent   Filament test decreased sensation with micro filament    Pulse DP: 1+   Deformities: None        Data Reviewed:       No results found for: HBA1C, HBA1CEXT, HGBE8, GLU, GESTF, GLUCPOC, MCACR, MCA1, MCA2, MCA3, MCAU, LDL, LDLC, DLDLP, CRES, CREAPOC, ACREA, CREA, REFC3, REFC4, HBA1CEXT, HBA1CEXT   No results found for: GFRNA, GFRNAPOC, GFRAA, GFRAAPOC, CREA, CREAPOC, BUN, IBUN, BUNPOC, NA, NAPOC, K, KPOCT, CL, CLPOC, CO2, CO2POC, MG, PHOS, ALBEU, PTH, PTHILT, EPO    Assessment/Plan:     1. Type 2 diabetes mellitus with hyperglycemia, with long-term current use of insulin (HCC)        1. Type 2 Diabetes Mellitus   No results found for: HBA1C, HGBE8, HBA1CPOC, HBA1CEXT, HBA1CEXT  He is not checking the blood glucose, uncontrolled diabetes mellitus  Discussed the importance of monitoring the blood glucose  Levemir 15 units at night, for now continue NovoLog/Humalog 4 units before meals.  Given CKD risk of hypoglycemia is very high, need close monitoring.  He was asked to come back in 6 weeks with a log      Diabetic issues reviewed : glycemic goals , written exchange diet given, low carbohydrate diet, weight control , home glucose monitoring emphasized,  hypoglycemia management and long term diabetic complications discussed.   FLU annually ,Pneumovax ,aspirin daily,annual eye exam,microalbumin    2.  HTN : Continue current therapy     3. Hyperlipidemia : Continue statin.    4.  Diabetic nephropathy: Good glycemic control stressed        Follow-up and Dispositions    ?? Return in about 6 weeks (around 11/18/2018).         Thank you for allowing me to participate in the care of this patient.    Thane Edu, MD      Patient verbalized understanding     Voice-recognition software was used to generate this report, which may result in some phonetic-based errors in the grammar and contents.  Even though attempts were made to correct all the mistakes, some may have been missed and remained in the body of the report.

## 2018-10-07 NOTE — Progress Notes (Signed)
 Room 1    Identified pt with two pt identifiers(name and DOB). Reviewed record in preparation for visit and have obtained necessary documentation. All patient medications has been reviewed.  Chief Complaint   Patient presents with   . Establish Care   . Diabetes     Learning Assessment 10/07/2018   PRIMARY LEARNER Patient   BARRIERS PRIMARY LEARNER NONE   CO-LEARNER CAREGIVER No   PRIMARY LANGUAGE ENGLISH   INTERPRETER NEED No   LEARNER PREFERENCE PRIMARY OTHER (COMMENT)   LEARNING SPECIAL TOPICS none   ANSWERED BY patient   RELATIONSHIP SELF       Health Maintenance Due   Topic   . HEMOGLOBIN A1C Q6M    . LIPID PANEL Q1    . FOOT EXAM Q1    . MICROALBUMIN Q1    . EYE EXAM RETINAL OR DILATED    . DTaP/Tdap/Td series (1 - Tdap)   . Shingrix Vaccine Age 15> (1 of 2)   . GLAUCOMA SCREENING Q2Y        Vitals:    10/07/18 1249 10/07/18 1257   BP: (!) 210/103 (!) 205/110   Pulse: 60    Resp: 18    Temp: 97.7 F (36.5 C)    TempSrc: Oral    SpO2: 99%    Weight: 183 lb 9.6 oz (83.3 kg)    Height: 5' 8.7 (1.745 m)        Wt Readings from Last 3 Encounters:   10/07/18 183 lb 9.6 oz (83.3 kg)     Temp Readings from Last 3 Encounters:   10/07/18 97.7 F (36.5 C) (Oral)     BP Readings from Last 3 Encounters:   10/07/18 (!) 205/110     Pulse Readings from Last 3 Encounters:   10/07/18 60       No results found for: HBA1C, HGBE8, HBA1CPOC, HBA1CEXT, HBA1CEXT    Coordination of Care Questionnaire:   1) Have you been to an emergency room, urgent care, or hospitalized since your last visit?   no       2. Have seen or consulted any other health care provider since your last visit? NO    3) Do you have an Advanced Directive/ Living Will in place? NO  If yes, do we have a copy on file NO  If no, would you like information NO    Patient is accompanied by self I have received verbal consent from Haider Hornaday to discuss any/all medical information while they are present in the room.

## 2018-10-07 NOTE — Progress Notes (Signed)
Progress Notes by Hoyle Sauer, MD at 10/07/18 1300                Author: Hoyle Sauer, MD  Service: --  Author Type: Physician       Filed: 10/08/18 1524  Encounter Date: 10/07/2018  Status: Signed          Editor: Shyne Resch, Elayne Guerin, MD (Physician)                  [facility]                 Thane Edu ,MD               Patient Information   Date:10/08/2018   Name : Daniel Arellano 77 y.o.      D.O.B : 10/26/1940           Referred by: Ardean Larsen, NP               Chief Complaint       Patient presents with        ?  Establish Care        ?  Diabetes           History of Present Illness: Daniel Arellano is a  77 y.o. male here for initial visit    Type 2 diabetes diagnosed 20 years ago, complicated by nephropathy   He is supposed to be on Levemir, NovoLog but not taking it consistently   He is checking the blood glucose intermittently.  He did not take insulin this morning    walks for 10 minutes a day    He also did not take the blood pressure medication today.   Blood pressure is high   No chest pain, shortness of breath, blurred vision   History of CVA, CAD however patient denies having CAD      No hypoglycemia   Diet reportedly is healthy   He also has underlying hypertension, hyperlipidemia        Wt Readings from Last 3 Encounters:        10/07/18  183 lb 9.6 oz (83.3 kg)             BP Readings from Last 3 Encounters:        10/07/18  (!) 205/110                   Past Medical History:        Diagnosis  Date         ?  Chronic kidney disease, stage 3 (HCC)       ?  Coronary artery disease involving native coronary artery of native heart without angina pectoris       ?  CVA (cerebral vascular accident) [facility])       ?  Essential hypertension       ?  Hyperlipidemia       ?  Sequela of cerebrovascular accident           ?  Type 2 diabetes mellitus ([place])            Current Outpatient Medications        Medication  Sig         ?  amLODIPine (NORVASC) 10 mg  tablet  Take  by mouth daily.     ?  clopidogrel (PLAVIX) 75 mg tab  Take  by mouth.     ?  alcohol swabs (ALCOHOL PREP PADS) padm  by Apply Externally route.     ?  aspirin delayed-release 81 mg tablet  Take  by mouth daily.     ?  atorvastatin (LIPITOR) 40 mg tablet  Take  by mouth daily.     ?  lisinopril (PRINIVIL, ZESTRIL) 20 mg tablet  Take  by mouth daily.     ?  furosemide (LASIX) 40 mg tablet  Take  by mouth daily.     ?  metoprolol succinate (TOPROL-XL) 100 mg tablet  Take  by mouth daily.     ?  hydrALAZINE (APRESOLINE) 100 mg tablet  Take 100 mg by mouth three (3) times daily.     ?  insulin aspart U-100 (NOVOLOG FLEXPEN U-100 INSULIN) 100 unit/mL (3 mL) inpn  4 Units by SubCUTAneous route Before breakfast and dinner.     ?  Blood-Glucose Meter (FREESTYLE LITE METER) monitoring kit  To test sugars     ?  glucose blood VI test strips (FREESTYLE LITE STRIPS) strip  Tests three times aday         ?  insulin detemir U-100 (LEVEMIR U-100 INSULIN) 100 unit/mL injection  15 Units by SubCUTAneous route nightly.          No current facility-administered medications for this visit.         No Known Allergies      Review of Systems:  All 10 systems reviewed and are negative other than mentioned in HPI      Physical Examination:    Blood pressure  (!) 205/110, pulse 60, temperature 97.7 ??F (36.5 ??C), temperature source Oral, resp. rate 18, height 5' 8.7" (1.745 m), weight 183 lb 9.6 oz (83.3 kg), SpO2 99 %.  Estimated body mass index is 27.35 kg/m?? as calculated from the following:     Height as of this encounter: 5' 8.7" (1.745 m).   -    Weight as of this encounter: 183 lb 9.6 oz (83.3 kg).   -  General: pleasant, no distress, good eye contact   -  HEENT: no pallor, no periorbital edema, EOMI   -  Neck: supple, no thyromegaly,   -  Cardiovascular: regular,  normal S1 and S2,   -  Respiratory: clear to auscultation bilaterally   -  Gastrointestinal: soft, nontender, nondistended,  BS +   -  Musculoskeletal: no  proximal muscle weakness in upper or lower extremities   -  Integumentary: no acanthosis nigricans,no edema,    -  Neurological: alert and oriented   -  Psychiatric: normal mood and affect   -  Skin: color, texture, turgor normal.       Diabetic foot exam: December 2019      Left:      Vibratory sensation absent    Filament test decreased sensation with micro filament    Pulse DP: 1+     Deformities: Callus   Right:     Vibratory sensation absent    Filament test decreased sensation with micro filament    Pulse DP: 1+    Deformities: None            Data Reviewed:          No results found for: HBA1C, HBA1CEXT, HGBE8, GLU, GESTF, GLUCPOC, MCACR, MCA1, MCA2, MCA3, MCAU, LDL, LDLC, DLDLP, CRES, CREAPOC, ACREA, CREA, REFC3, REFC4, HBA1CEXT, HBA1CEXT     No results found for: GFRNA, GFRNAPOC, GFRAA, GFRAAPOC, CREA, CREAPOC, BUN, IBUN, BUNPOC, NA,  NAPOC, K, KPOCT, CL, CLPOC, CO2, CO2POC, MG, PHOS, ALBEU, PTH, PTHILT, EPO      Assessment/Plan:          1.  Type 2 diabetes mellitus with hyperglycemia, with long-term current use of insulin (HCC)            1. Type 2 Diabetes Mellitus    No results found for: HBA1C, HGBE8, HBA1CPOC, HBA1CEXT, HBA1CEXT   He is not checking the blood glucose, uncontrolled diabetes mellitus   Discussed the importance of monitoring the blood glucose   Levemir 15 units at night, for now continue NovoLog/Humalog 4 units before meals.   Given CKD risk of hypoglycemia is very high, need close monitoring.  He was asked to come back in 6 weeks with a log         Diabetic issues reviewed : glycemic goals , written exchange diet given, low carbohydrate diet, weight control , home glucose monitoring emphasized,  hypoglycemia management and long term diabetic complications discussed.    FLU annually ,Pneumovax ,aspirin daily,annual eye exam,microalbumin      2.  HTN : Continue current  therapy       3. Hyperlipidemia : Continue statin.      4.  Diabetic nephropathy: Good glycemic control stressed               Follow-up and Dispositions      ??  Return in about 6 weeks (around 11/18/2018).                Thank you for allowing me to participate in the care of this patient.      Thane Edu, MD         Patient verbalized understanding       Voice-recognition software was used to generate this report, which may result in some phonetic-based errors in the grammar and contents.  Even though attempts were made to correct  all the mistakes, some may have been missed and remained in the body of the report.

## 2018-10-07 NOTE — Addendum Note (Signed)
Addendum Note by Debe CoderMuthyala, Karime Scheuermann Devi, MD at 10/07/18 1300                Author: Debe CoderMuthyala, Lorae Roig Devi, MD  Service: --  Author Type: Physician       Filed: 10/08/18 1534  Encounter Date: 10/07/2018  Status: Signed          Editor: Debe CoderMuthyala, Denetta Fei Devi, MD (Physician)          Addended by: Debe CoderMUTHYALA, Anandi Abramo DEVI on: 10/08/2018 03:34 PM    Modules accepted: Orders

## 2018-11-18 ENCOUNTER — Encounter: Attending: "Endocrinology | Primary: Family

## 2018-11-18 NOTE — Progress Notes (Deleted)
Daniel Arellano is a 78 y.o. male here for No chief complaint on file.      Functional glucose monitor and record keeping system? -   Eye exam within last year? -   Foot exam within last year? - on file    1. Have you been to the ER, urgent care clinic since your last visit?  Hospitalized since your last visit? -    2. Have you seen or consulted any other health care providers outside of the [facility] since your last visit?  Include any pap smears or colon screening.-

## 2021-06-01 ENCOUNTER — Emergency Department: Admit: 2021-06-01 | Payer: MEDICARE | Primary: Family

## 2021-06-01 ENCOUNTER — Inpatient Hospital Stay
Admit: 2021-06-01 | Discharge: 2021-06-04 | Disposition: A | Discharge status: Home / Self Care | Payer: MEDICARE | Attending: Family Medicine | Admitting: Family Medicine

## 2021-06-01 DIAGNOSIS — I480 Paroxysmal atrial fibrillation: Principal | ICD-10-CM

## 2021-06-01 LAB — CBC WITH AUTOMATED DIFF
ABS. BASOPHILS: 0 10*3/uL (ref 0.0–0.1)
ABS. EOSINOPHILS: 0.1 10*3/uL (ref 0.0–0.4)
ABS. IMM. GRANS.: 0 10*3/uL (ref 0.00–0.04)
ABS. LYMPHOCYTES: 1.1 10*3/uL (ref 0.8–3.5)
ABS. MONOCYTES: 0.4 10*3/uL (ref 0.0–1.0)
ABS. NEUTROPHILS: 2.3 10*3/uL (ref 1.8–8.0)
ABSOLUTE NRBC: 0 10*3/uL (ref 0.00–0.01)
BASOPHILS: 1 % (ref 0–1)
EOSINOPHILS: 2 % (ref 0–7)
HCT: 36.9 % (ref 36.6–50.3)
HGB: 12 g/dL — ABNORMAL LOW (ref 12.1–17.0)
IMMATURE GRANULOCYTES: 0 % (ref 0–0.5)
LYMPHOCYTES: 29 % (ref 12–49)
MCH: 29.3 PG (ref 26.0–34.0)
MCHC: 32.5 g/dL (ref 30.0–36.5)
MCV: 90 FL (ref 80.0–99.0)
MONOCYTES: 11 % (ref 5–13)
MPV: 10.9 FL (ref 8.9–12.9)
NEUTROPHILS: 57 % (ref 32–75)
NRBC: 0 PER 100 WBC
PLATELET: 139 10*3/uL — ABNORMAL LOW (ref 150–400)
RBC: 4.1 M/uL (ref 4.10–5.70)
RDW: 13.4 % (ref 11.5–14.5)
WBC: 3.9 10*3/uL — ABNORMAL LOW (ref 4.1–11.1)

## 2021-06-01 LAB — METABOLIC PANEL, COMPREHENSIVE
A-G Ratio: 0.9 — ABNORMAL LOW (ref 1.1–2.2)
ALT (SGPT): 19 U/L (ref 12–78)
AST (SGOT): 32 U/L (ref 15–37)
Albumin: 3.6 g/dL (ref 3.5–5.0)
Alk. phosphatase: 83 U/L (ref 45–117)
Anion gap: 7 mmol/L (ref 5–15)
BUN/Creatinine ratio: 4 — ABNORMAL LOW (ref 12–20)
BUN: 21 mg/dL — ABNORMAL HIGH (ref 6–20)
Bilirubin, total: 0.6 mg/dL (ref 0.2–1.0)
CO2: 30 mmol/L (ref 21–32)
Calcium: 10.2 mg/dL — ABNORMAL HIGH (ref 8.5–10.1)
Chloride: 98 mmol/L (ref 97–108)
Creatinine: 5.45 mg/dL — ABNORMAL HIGH (ref 0.70–1.30)
GFR est AA: 12 mL/min/{1.73_m2} — ABNORMAL LOW (ref >60)
GFR est non-AA: 10 mL/min/{1.73_m2} — ABNORMAL LOW (ref >60)
Globulin: 4.1 g/dL — ABNORMAL HIGH (ref 2.0–4.0)
Glucose: 121 mg/dL — ABNORMAL HIGH (ref 65–100)
Potassium: 3.1 mmol/L — ABNORMAL LOW (ref 3.5–5.1)
Protein, total: 7.7 g/dL (ref 6.4–8.2)
Sodium: 135 mmol/L — ABNORMAL LOW (ref 136–145)

## 2021-06-01 LAB — MAGNESIUM
Magnesium: 2.2 mg/dL (ref 1.6–2.4)
Magnesium: 2.2 mg/dL (ref 1.6–2.4)

## 2021-06-01 LAB — TSH 3RD GENERATION
TSH: 0.02 u[IU]/mL — ABNORMAL LOW (ref 0.36–3.74)
TSH: 0.02 u[IU]/mL — ABNORMAL LOW (ref 0.36–3.74)

## 2021-06-01 LAB — CBC WITH AUTO DIFFERENTIAL
Basophils %: 1 % (ref 0–1)
Basophils Absolute: 0 10*3/uL (ref 0.0–0.1)
Eosinophils %: 2 % (ref 0–7)
Eosinophils Absolute: 0.1 10*3/uL (ref 0.0–0.4)
Granulocyte Absolute Count: 0 10*3/uL (ref 0.00–0.04)
Hematocrit: 36.9 % (ref 36.6–50.3)
Hemoglobin: 12 g/dL — ABNORMAL LOW (ref 12.1–17.0)
Immature Granulocytes: 0 % (ref 0–0.5)
Lymphocytes %: 29 % (ref 12–49)
Lymphocytes Absolute: 1.1 10*3/uL (ref 0.8–3.5)
MCH: 29.3 PG (ref 26.0–34.0)
MCHC: 32.5 g/dL (ref 30.0–36.5)
MCV: 90 FL (ref 80.0–99.0)
MPV: 10.9 FL (ref 8.9–12.9)
Monocytes %: 11 % (ref 5–13)
Monocytes Absolute: 0.4 10*3/uL (ref 0.0–1.0)
NRBC Absolute: 0 10*3/uL (ref 0.00–0.01)
Neutrophils %: 57 % (ref 32–75)
Neutrophils Absolute: 2.3 10*3/uL (ref 1.8–8.0)
Nucleated RBCs: 0 PER 100 WBC
Platelets: 139 10*3/uL — ABNORMAL LOW (ref 150–400)
RBC: 4.1 M/uL (ref 4.10–5.70)
RDW: 13.4 % (ref 11.5–14.5)
WBC: 3.9 10*3/uL — ABNORMAL LOW (ref 4.1–11.1)

## 2021-06-01 LAB — COMPREHENSIVE METABOLIC PANEL
ALT: 19 U/L (ref 12–78)
AST: 32 U/L (ref 15–37)
Albumin/Globulin Ratio: 0.9 — ABNORMAL LOW (ref 1.1–2.2)
Albumin: 3.6 g/dL (ref 3.5–5.0)
Alkaline Phosphatase: 83 U/L (ref 45–117)
Anion Gap: 7 mmol/L (ref 5–15)
BUN: 21 mg/dL — ABNORMAL HIGH (ref 6–20)
Bun/Cre Ratio: 4 — ABNORMAL LOW (ref 12–20)
CO2: 30 mmol/L (ref 21–32)
Calcium: 10.2 mg/dL — ABNORMAL HIGH (ref 8.5–10.1)
Chloride: 98 mmol/L (ref 97–108)
Creatinine: 5.45 mg/dL — ABNORMAL HIGH (ref 0.70–1.30)
EGFR IF NonAfrican American: 10 mL/min/{1.73_m2} — ABNORMAL LOW (ref >60)
GFR African American: 12 mL/min/{1.73_m2} — ABNORMAL LOW (ref >60)
Globulin: 4.1 g/dL — ABNORMAL HIGH (ref 2.0–4.0)
Glucose: 121 mg/dL — ABNORMAL HIGH (ref 65–100)
Potassium: 3.1 mmol/L — ABNORMAL LOW (ref 3.5–5.1)
Sodium: 135 mmol/L — ABNORMAL LOW (ref 136–145)
Total Bilirubin: 0.6 mg/dL (ref 0.2–1.0)
Total Protein: 7.7 g/dL (ref 6.4–8.2)

## 2021-06-01 MED ORDER — LISINOPRIL 5 MG TAB
5 mg | Freq: Every day | ORAL | Status: DC
Start: 2021-06-01 — End: 2021-06-04
  Administered 2021-06-04: 16:00:00 via ORAL

## 2021-06-01 MED ORDER — METOPROLOL SUCCINATE SR 25 MG 24 HR TAB
25 mg | Freq: Every day | ORAL | Status: DC
Start: 2021-06-01 — End: 2021-06-04
  Administered 2021-06-03 – 2021-06-04 (×2): via ORAL

## 2021-06-01 MED ORDER — INSULIN GLARGINE 100 UNIT/ML INJECTION
100 unit/mL | Freq: Every evening | SUBCUTANEOUS | Status: DC
Start: 2021-06-01 — End: 2021-06-04
  Administered 2021-06-02 – 2021-06-04 (×3): via SUBCUTANEOUS

## 2021-06-01 MED ORDER — ATORVASTATIN 40 MG TAB
40 mg | Freq: Every day | ORAL | Status: DC
Start: 2021-06-01 — End: 2021-06-04
  Administered 2021-06-02 – 2021-06-04 (×3): via ORAL

## 2021-06-01 MED ORDER — CLOPIDOGREL 75 MG TAB
75 mg | Freq: Every day | ORAL | Status: DC
Start: 2021-06-01 — End: 2021-06-03
  Administered 2021-06-02: 15:00:00 via ORAL

## 2021-06-01 MED ORDER — HYDRALAZINE 50 MG TAB
50 mg | Freq: Three times a day (TID) | ORAL | Status: DC
Start: 2021-06-01 — End: 2021-06-04
  Administered 2021-06-02 – 2021-06-04 (×5): via ORAL

## 2021-06-01 MED ORDER — METOPROLOL TARTRATE 5 MG/5 ML IV SOLN
5 mg/ mL | INTRAVENOUS | Status: DC | PRN
Start: 2021-06-01 — End: 2021-06-04
  Administered 2021-06-01: 20:00:00 via INTRAVENOUS

## 2021-06-01 MED ORDER — POTASSIUM CHLORIDE SR 10 MEQ TAB
10 mEq | ORAL | Status: AC
Start: 2021-06-01 — End: 2021-06-01
  Administered 2021-06-02: 01:00:00 via ORAL

## 2021-06-01 MED ORDER — ASPIRIN 81 MG TAB, DELAYED RELEASE
81 mg | Freq: Every day | ORAL | Status: DC
Start: 2021-06-01 — End: 2021-06-03
  Administered 2021-06-02: 15:00:00 via ORAL

## 2021-06-01 MED ORDER — FUROSEMIDE 40 MG TAB
40 mg | Freq: Every day | ORAL | Status: DC
Start: 2021-06-01 — End: 2021-06-04
  Administered 2021-06-02 – 2021-06-04 (×2): via ORAL

## 2021-06-01 MED ORDER — HEPARIN (PORCINE) IN D5W 25,000 UNIT/250 ML IV
25000 unit/250 mL(100 unit/mL) | INTRAVENOUS | Status: DC
Start: 2021-06-01 — End: 2021-06-03
  Administered 2021-06-02 – 2021-06-03 (×6): via INTRAVENOUS

## 2021-06-01 MED FILL — METOPROLOL TARTRATE 5 MG/5 ML IV SOLN: 5 mg/ mL | INTRAVENOUS | Qty: 5

## 2021-06-01 NOTE — ED Notes (Signed)
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 Pt arrives in a fib rvr. Pt has no complaints at this time. Denies Cp/SOB. States he has no hx of a fib.

## 2021-06-01 NOTE — ED Provider Notes (Signed)
ED Provider Notes by Sharion Balloon, MD at 06/01/21 1558                Author: Sharion Balloon, MD  Service: EMERGENCY  Author Type: Physician       Filed: 06/07/21 1033  Date of Service: 06/01/21 1558  Status: Signed          Editor: Sharion Balloon, MD (Physician)               EMERGENCY DEPARTMENT HISTORY AND PHYSICAL EXAM           Date: 06/01/2021   Patient Name: Daniel Arellano           History of Presenting Illness          Chief Complaint       Patient presents with        ?  Irregular Heart Beat           History Provided By: Patient      HPI: Daniel Arellano, 80 y.o. male with a past medical history significant for ESRD on HD presents to the ED with cc of atrial fibrillation. Patient  has no complaints. Was in usual state of health and finished HD treatment, but staff noticed his heart rate was fast and irregular. Denies F/C/N/V/D, cough, CP, SOB, hx of thyroid disease or prior Afib.      There are no other complaints, changes, or physical findings at this time.      PCP: Ardean Larsen, NP        Current Facility-Administered Medications             Medication  Dose  Route  Frequency  Provider  Last Rate  Last Admin              ?  metoprolol (LOPRESSOR) injection 5 mg   5 mg  IntraVENous  Q5MIN PRN  Sharion Balloon, MD                Current Outpatient Medications          Medication  Sig  Dispense  Refill           ?  amLODIPine (NORVASC) 10 mg tablet  Take  by mouth daily.         ?  clopidogrel (PLAVIX) 75 mg tab  Take  by mouth.         ?  alcohol swabs (ALCOHOL PREP PADS) padm  by Apply Externally route.         ?  aspirin delayed-release 81 mg tablet  Take  by mouth daily.         ?  atorvastatin (LIPITOR) 40 mg tablet  Take  by mouth daily.         ?  lisinopril (PRINIVIL, ZESTRIL) 20 mg tablet  Take  by mouth daily.         ?  furosemide (LASIX) 40 mg tablet  Take  by mouth daily.         ?  metoprolol succinate (TOPROL-XL) 100 mg tablet  Take  by mouth daily.         ?  hydrALAZINE (APRESOLINE)  100 mg tablet  Take 100 mg by mouth three (3) times daily.         ?  insulin aspart U-100 (NOVOLOG FLEXPEN U-100 INSULIN) 100 unit/mL (3 mL) inpn  4 Units by SubCUTAneous route Before breakfast and dinner.  15 mL  5     ?  Blood-Glucose Meter (FREESTYLE LITE METER) monitoring kit  To test sugars  1 Kit  0     ?  glucose blood VI test strips (FREESTYLE LITE STRIPS) strip  Tests three times aday  300 Strip  4           ?  insulin detemir U-100 (LEVEMIR U-100 INSULIN) 100 unit/mL injection  15 Units by SubCUTAneous route nightly.  15 mL  4             Past History        Past Medical History:     Past Medical History:        Diagnosis  Date         ?  Chronic kidney disease, stage 3 (HCC)       ?  Coronary artery disease involving native coronary artery of native heart without angina pectoris       ?  CVA (cerebral vascular accident) [facility])       ?  Essential hypertension       ?  Hyperlipidemia       ?  Sequela of cerebrovascular accident           ?  Type 2 diabetes mellitus ([place])             Past Surgical History:   No past surgical history on file.      Family History:     Family History         Problem  Relation  Age of Onset          ?  Diabetes  Mother       ?  Hypertension  Mother       ?  Diabetes  Father            ?  Hypertension  Father             Social History:     Social History          Tobacco Use         ?  Smoking status:  Never     ?  Smokeless tobacco:  Never       Substance Use Topics         ?  Alcohol use:  Not Currently         ?  Drug use:  Never           Allergies:   No Known Allergies           Review of Systems     Constitutional: Negative except as in HPI.   Eyes: Negative except as in HPI.   ENT: Negative except as in HPI.   Cardiovascular: Negative except as in HPI.   Respiratory: Negative except as in HPI.   Gastrointestinal: Negative except as in HPI.   Genitourinary: Negative except as in HPI.   Musculoskeletal: Negative except as in HPI.   Integumentary: Negative except as in  HPI.   Neurological: Negative except as in HPI.   Psychiatric: Negative except as in HPI.   Endocrine: Negative except as in HPI.   Hematologic/Lymphatic: Negative except as in HPI.   Allergic/Immunologic: Negative except as in HPI.        Physical Exam     Constitutional: Awake and alert, interactive, NAD   Eyes: PERRL, no injection or scleral icterus, no discharge   HEENT: NCAT, neck supple, MMM, no oropharyngeal exudates   CV: Tachycardic, irregularly irregular ,  no m/r/g   Respiratory: CTAB, no r/r/w   GI: Abd soft, nondistended, nontender   GU: Deferred   MSK: FROM, no joint effusions or edema   Skin: No rashes   Neuro: CN2-12 intact, symmetric facies, fluent speech.   Psych: Well-groomed, normal speech, behavior, appropriate mood        Lab and Diagnostic Study Results        Labs -    No results found for this or any previous visit (from the past 12 hour(s)).      Radiologic Studies -    _0 @     CT Results  (Last 48 hours)             None                    CXR Results  (Last 48 hours)             None                       Medical Decision Making and ED Course     - I am the first and primary provider for this patient AND AM THE PRIMARY PROVIDER OF RECORD.      - I reviewed the vital signs, available nursing notes, past medical history, past surgical history, family history and social history.      - Initial assessment performed. The patients presenting problems have been discussed, and the staff are in agreement with the care plan formulated and outlined with them.  I have encouraged them to ask questions as they arise throughout their visit.      Vital Signs-Reviewed the patient's vital signs.      No data found.      EKG interpretation: Afib? (Undetermined rhythm but no clear p-waves) w/PVCs_1 , nl QRS,  left axis, Qtc 496, no STE/STD or nonanatomic TWI.            Provider Notes (Medical Decision Making):    74M w/Afib w/RVR. Will check electrolytes, TSH and CXR to eval for underlying  cause. Will attempt to rate control w/metop 58m q529m x3 given pt already on metoprolol. Dispo admit likely floor.      ED Course:            ED Course as of 06/07/21 1032       Wed Jun 01, 2021        1716  TSH(!): 0.02   Will check free T4, likely cause of Afib w/RVR. [YA]     1753  Pulse (Heart Rate): 100   Improved s/p 1 dose of metoprolol. [YA]     [id number] out to me. Afib rvr now controlled, hx of ESRD on HD. TSH 0.02. called [facility] few times, paged 30 min ago.  [JS]     1904  Admitted to Dr. MoLesle Reek            ED Course User Index   [JS] SeVernell MorgansMD   [YA] AlSharion BalloonMD                Consultations:        Hospitalist Dr. MoClinton Sawyer      Disposition        [facility]: [facility] with the admitting physician Dr. MoClinton Sawyer     Admitted  Diagnosis        Clinical Impression:       1.  Atrial fibrillation with rapid ventricular response ([place])            Attestations:      Sharion Balloon, MD

## 2021-06-01 NOTE — ED Notes (Signed)
Dr. Gabriel Earing made aware of pt's elevated troponin level. Mohuiddin stated to follow through with the heparin drip as ordered and put in consult for cardiology.

## 2021-06-02 ENCOUNTER — Ambulatory Visit: Admit: 2021-06-02 | Payer: MEDICARE | Primary: Family

## 2021-06-02 ENCOUNTER — Inpatient Hospital Stay: Admit: 2021-06-02 | Payer: MEDICARE | Primary: Family

## 2021-06-02 LAB — CBC WITH AUTOMATED DIFF
ABS. BASOPHILS: 0.1 10*3/uL (ref 0.0–0.1)
ABS. EOSINOPHILS: 0.1 10*3/uL (ref 0.0–0.4)
ABS. IMM. GRANS.: 0 10*3/uL (ref 0.00–0.04)
ABS. LYMPHOCYTES: 1.1 10*3/uL (ref 0.8–3.5)
ABS. MONOCYTES: 0.4 10*3/uL (ref 0.0–1.0)
ABS. NEUTROPHILS: 2.9 10*3/uL (ref 1.8–8.0)
ABSOLUTE NRBC: 0 10*3/uL (ref 0.00–0.01)
BASOPHILS: 1 % (ref 0–1)
EOSINOPHILS: 1 % (ref 0–7)
HCT: 34.4 % — ABNORMAL LOW (ref 36.6–50.3)
HGB: 11.2 g/dL — ABNORMAL LOW (ref 12.1–17.0)
IMMATURE GRANULOCYTES: 0 % (ref 0–0.5)
LYMPHOCYTES: 24 % (ref 12–49)
MCH: 29.3 PG (ref 26.0–34.0)
MCHC: 32.6 g/dL (ref 30.0–36.5)
MCV: 90.1 FL (ref 80.0–99.0)
MONOCYTES: 8 % (ref 5–13)
MPV: 11.3 FL (ref 8.9–12.9)
NEUTROPHILS: 66 % (ref 32–75)
NRBC: 0 PER 100 WBC
PLATELET: 166 10*3/uL (ref 150–400)
RBC: 3.82 M/uL — ABNORMAL LOW (ref 4.10–5.70)
RDW: 13.6 % (ref 11.5–14.5)
WBC: 4.4 10*3/uL (ref 4.1–11.1)

## 2021-06-02 LAB — ECHO ADULT COMPLETE
AV Area by Peak Velocity: 2.4 cm2
AV Peak Gradient: 12 mmHg
AV Peak Velocity: 1.8 m/s
AV Velocity Ratio: 0.78
AVA/BSA Peak Velocity: 1.3 cm2/m2
Ao Root Index: 1.71 cm/m2
Aortic Root: 3.2 cm
Ascending Aorta Index: 1.71 cm/m2
Ascending Aorta: 3.2 cm
Descending Aorta Index: 1.34 cm/m2
Descending Aorta: 2.5 cm
E/E' Lateral: 9.75
E/E' Ratio (Averaged): 10.45
E/E' Septal: 11.14
Est. RA Pressure: 3 mmHg
Fractional Shortening 2D: 35 % (ref 28–44)
IVSd: 1.2 cm — AB (ref 0.6–1.0)
LA Area 4C: 20.9 cm2
LA Diameter: 4 cm
LA Major Axis: 6.3 cm
LA Size Index: 2.14 cm/m2
LA/AO Root Ratio: 1.25
LV E' Lateral Velocity: 8 cm/s
LV E' Septal Velocity: 7 cm/s
LV EDV A4C: 110 mL
LV EDV Index A4C: 59 mL/m2
LV ESV A4C: 39 mL
LV ESV Index A4C: 21 mL/m2
LV Ejection Fraction A2C: 65 %
LV Ejection Fraction A4C: 65 %
LV Mass 2D Index: 73.9 g/m2 (ref 49–115)
LV Mass 2D: 138.2 g (ref 88–224)
LV RWT Ratio: 0.59
LVIDd Index: 1.98 cm/m2
LVIDd: 3.7 cm — AB (ref 4.2–5.9)
LVIDs Index: 1.28 cm/m2
LVIDs: 2.4 cm
LVOT Area: 3.1 cm2
LVOT Diameter: 2 cm
LVOT Peak Gradient: 7 mmHg
LVOT Peak Velocity: 1.4 m/s
LVPWd: 1.1 cm — AB (ref 0.6–1.0)
MR Peak Gradient: 8 mmHg
MR Peak Velocity: 1.4 m/s
MV A Velocity: 1.2 m/s
MV E Velocity: 0.78 m/s
MV E Wave Deceleration Time: 215 ms
MV E/A: 0.65
MV Max Velocity: 1.3 m/s
MV Mean Gradient: 3 mmHg
MV Mean Velocity: 0.8 m/s
MV Peak Gradient: 7 mmHg
MV VTI: 35.4 cm
RVSP: 32 mmHg
TAPSE: 2 cm (ref 1.7–?)
TR Max Velocity: 2.69 m/s
TR Peak Gradient: 29 mmHg

## 2021-06-02 LAB — PTT
aPTT: 30.3 s (ref 21.2–34.1)
aPTT: >153 s — CR (ref 21.2–34.1)
aPTT: 39.7 s — ABNORMAL HIGH (ref 21.2–34.1)
aPTT: 70.3 s — ABNORMAL HIGH (ref 21.2–34.1)
aPTT: 52.8 s — ABNORMAL HIGH (ref 21.2–34.1)

## 2021-06-02 LAB — NUCLEAR CARDIAC STRESS TEST
Baseline Diastolic BP: 65 mmHg
Baseline HR: 76 {beats}/min
Baseline Systolic BP: 138 mmHg
Nuc Stress EF: 50 %
Stress Peak HR: 119 {beats}/min
Stress Percent HR Achieved: 85 %
Stress Stage 1 HR: 87 {beats}/min
Stress Stage 2 HR: 85 {beats}/min
Stress Stage 3 HR: 86 {beats}/min
Stress Stage 4 HR: 80 {beats}/min
Stress Target HR: 140 {beats}/min

## 2021-06-02 LAB — EKG, 12 LEAD, INITIAL
Atrial Rate: 138 {beats}/min
Atrial Rate: 156 {beats}/min
Calculated R Axis: -37 degrees
Calculated R Axis: -44 degrees
Calculated T Axis: 81 degrees
Calculated T Axis: 83 degrees
Q-T Interval: 324 ms
Q-T Interval: 332 ms
QRS Duration: 88 ms
QRS Duration: 90 ms
QTC Calculation (Bezet): 496 ms
QTC Calculation (Bezet): 499 ms
Ventricular Rate: 136 {beats}/min
Ventricular Rate: 141 {beats}/min

## 2021-06-02 LAB — T3, FREE
Free Triiodothyronine (T3): 2.5 pg/mL (ref 2.2–4.0)
T3, Free: 2.5 pg/mL (ref 2.2–4.0)

## 2021-06-02 LAB — TROPONIN-HIGH SENSITIVITY
Troponin-High Sensitivity: 132 ng/L — CR (ref 0–76)
Troponin-High Sensitivity: 166 ng/L — CR (ref 0–76)

## 2021-06-02 LAB — T4, FREE
T4 Free: 1.6 ng/dL — ABNORMAL HIGH (ref 0.8–1.5)
T4, Free: 1.6 ng/dL — ABNORMAL HIGH (ref 0.8–1.5)

## 2021-06-02 LAB — GLUCOSE, POC: Glucose (POC): 114 mg/dL (ref 65–117)

## 2021-06-02 LAB — NM STRESS TEST WITH MYOCARDIAL PERFUSION
Baseline Diastolic BP: 65 mmHg
Baseline HR: 76 {beats}/min
Baseline Systolic BP: 138 mmHg
Left Ventricular Ejection Fraction: 50
Nuc Stress EF: 50 %
Stress Peak HR: 119 {beats}/min
Stress Percent HR Achieved: 85 %
Stress Stage 1 HR: 87 {beats}/min
Stress Stage 2 HR: 85 {beats}/min
Stress Stage 3 HR: 86 {beats}/min
Stress Stage 4 HR: 80 {beats}/min
Stress Target HR: 140 {beats}/min

## 2021-06-02 LAB — TRANSTHORACIC ECHOCARDIOGRAM (TTE) COMPLETE (CONTRAST/BUBBLE/3D PRN)
AV Area by Peak Velocity: 2.4 cm2
AV Peak Gradient: 12 mmHg
AV Peak Velocity: 1.8 m/s
AV Velocity Ratio: 0.78
AVA/BSA Peak Velocity: 1.3 cm2/m2
Ao Root Index: 1.71 cm/m2
Aortic Root: 3.2 cm
Ascending Aorta Index: 1.71 cm/m2
Ascending Aorta: 3.2 cm
Descending Aorta Index: 1.34 cm/m2
Descending Aorta: 2.5 cm
E/E' Lateral: 9.75
E/E' Ratio (Averaged): 10.45
E/E' Septal: 11.14
Est. RA Pressure: 3 mmHg
Fractional Shortening 2D: 35 % (ref 28–44)
IVSd: 1.2 cm — AB (ref 0.6–1)
LA Area 4C: 20.9 cm2
LA Diameter: 4 cm
LA Major Axis: 6.3 cm
LA Size Index: 2.14 cm/m2
LA/AO Root Ratio: 1.25
LV E' Lateral Velocity: 8 cm/s
LV E' Septal Velocity: 7 cm/s
LV EDV A4C: 110 mL
LV EDV Index A4C: 59 mL/m2
LV ESV A4C: 39 mL
LV ESV Index A4C: 21 mL/m2
LV Ejection Fraction A2C: 65 %
LV Ejection Fraction A4C: 65 %
LV Mass 2D Index: 73.9 g/m2 (ref 49–115)
LV Mass 2D: 138.2 g (ref 88–224)
LV RWT Ratio: 0.59
LVIDd Index: 1.98 cm/m2
LVIDd: 3.7 cm — AB (ref 4.2–5.9)
LVIDs Index: 1.28 cm/m2
LVIDs: 2.4 cm
LVOT Area: 3.1 cm2
LVOT Diameter: 2 cm
LVOT Peak Gradient: 7 mmHg
LVOT Peak Velocity: 1.4 m/s
LVPWd: 1.1 cm — AB (ref 0.6–1)
Left Ventricular Ejection Fraction: 63
MR Peak Gradient: 8 mmHg
MR Peak Velocity: 1.4 m/s
MV A Velocity: 1.2 m/s
MV E Velocity: 0.78 m/s
MV E Wave Deceleration Time: 215 ms
MV E/A: 0.65
MV Max Velocity: 1.3 m/s
MV Mean Gradient: 3 mmHg
MV Mean Velocity: 0.8 m/s
MV Peak Gradient: 7 mmHg
MV VTI: 35.4 cm
RVSP: 32 mmHg
TAPSE: 2 cm
TR Max Velocity: 2.69 m/s
TR Peak Gradient: 29 mmHg

## 2021-06-02 LAB — CBC WITH AUTO DIFFERENTIAL
Basophils %: 1 % (ref 0–1)
Basophils Absolute: 0.1 10*3/uL (ref 0.0–0.1)
Eosinophils %: 1 % (ref 0–7)
Eosinophils Absolute: 0.1 10*3/uL (ref 0.0–0.4)
Granulocyte Absolute Count: 0 10*3/uL (ref 0.00–0.04)
Hematocrit: 34.4 % — ABNORMAL LOW (ref 36.6–50.3)
Hemoglobin: 11.2 g/dL — ABNORMAL LOW (ref 12.1–17.0)
Immature Granulocytes: 0 % (ref 0–0.5)
Lymphocytes %: 24 % (ref 12–49)
Lymphocytes Absolute: 1.1 10*3/uL (ref 0.8–3.5)
MCH: 29.3 PG (ref 26.0–34.0)
MCHC: 32.6 g/dL (ref 30.0–36.5)
MCV: 90.1 FL (ref 80.0–99.0)
MPV: 11.3 FL (ref 8.9–12.9)
Monocytes %: 8 % (ref 5–13)
Monocytes Absolute: 0.4 10*3/uL (ref 0.0–1.0)
NRBC Absolute: 0 10*3/uL (ref 0.00–0.01)
Neutrophils %: 66 % (ref 32–75)
Neutrophils Absolute: 2.9 10*3/uL (ref 1.8–8.0)
Nucleated RBCs: 0 PER 100 WBC
Platelets: 166 10*3/uL (ref 150–400)
RBC: 3.82 M/uL — ABNORMAL LOW (ref 4.10–5.70)
RDW: 13.6 % (ref 11.5–14.5)
WBC: 4.4 10*3/uL (ref 4.1–11.1)

## 2021-06-02 LAB — EKG 12-LEAD
Atrial Rate: 138 {beats}/min
Atrial Rate: 156 {beats}/min
Q-T Interval: 324 ms
Q-T Interval: 332 ms
QRS Duration: 88 ms
QRS Duration: 90 ms
QTc Calculation (Bazett): 496 ms
QTc Calculation (Bazett): 499 ms
R Axis: -37 degrees
R Axis: -44 degrees
T Axis: 81 degrees
T Axis: 83 degrees
Ventricular Rate: 136 {beats}/min
Ventricular Rate: 141 {beats}/min

## 2021-06-02 LAB — APTT
aPTT: 30.3 s (ref 21.2–34.1)
aPTT: >153 s (ref 21.2–34.1)
aPTT: 39.7 s — ABNORMAL HIGH (ref 21.2–34.1)
aPTT: 70.3 s — ABNORMAL HIGH (ref 21.2–34.1)
aPTT: 52.8 s — ABNORMAL HIGH (ref 21.2–34.1)

## 2021-06-02 LAB — POCT GLUCOSE: POC Glucose: 114 mg/dL (ref 65–117)

## 2021-06-02 LAB — TROPONIN, HIGH SENSITIVITY
Troponin, High Sensitivity: 132 ng/L (ref 0–76)
Troponin, High Sensitivity: 166 ng/L (ref 0–76)

## 2021-06-02 MED ORDER — TECHNETIUM TC-99M SESTAMIBI (CARDIOLITE) INJECTION WITH DILUTION KIT
Freq: Once | INTRAVENOUS | Status: AC
Start: 2021-06-02 — End: 2021-06-02
  Administered 2021-06-02: 17:00:00 via INTRAVENOUS

## 2021-06-02 MED ORDER — HEPARIN (PORCINE) 1,000 UNIT/ML IJ SOLN
1000 unit/mL | INTRAMUSCULAR | Status: DC | PRN
Start: 2021-06-02 — End: 2021-06-03

## 2021-06-02 MED ORDER — REGADENOSON 0.4 MG/5 ML IV SYRINGE
0.4 mg/5 mL | INTRAVENOUS | Status: AC
Start: 2021-06-02 — End: 2021-06-02
  Administered 2021-06-02: 17:00:00 via INTRAVENOUS

## 2021-06-02 MED ORDER — HEPARIN (PORCINE) 1,000 UNIT/ML IJ SOLN
1000 unit/mL | INTRAMUSCULAR | Status: DC | PRN
Start: 2021-06-02 — End: 2021-06-03
  Administered 2021-06-02 – 2021-06-03 (×4): via INTRAVENOUS

## 2021-06-02 MED ORDER — TECHNETIUM TC 99M SESTAMIBI - CARDIOLITE
Freq: Once | Status: AC
Start: 2021-06-02 — End: 2021-06-02
  Administered 2021-06-02: 16:00:00 via INTRAVENOUS

## 2021-06-02 MED FILL — CLOPIDOGREL 75 MG TAB: 75 mg | ORAL | Qty: 1

## 2021-06-02 MED FILL — ATORVASTATIN 40 MG TAB: 40 mg | ORAL | Qty: 1

## 2021-06-02 MED FILL — FUROSEMIDE 40 MG TAB: 40 mg | ORAL | Qty: 1

## 2021-06-02 MED FILL — HEPARIN (PORCINE) 1,000 UNIT/ML IJ SOLN: 1000 unit/mL | INTRAMUSCULAR | Qty: 10

## 2021-06-02 MED FILL — LISINOPRIL 10 MG TAB: 10 mg | ORAL | Qty: 1

## 2021-06-02 MED FILL — LANTUS U-100 INSULIN 100 UNIT/ML SUBCUTANEOUS SOLUTION: 100 unit/mL | SUBCUTANEOUS | Qty: 15

## 2021-06-02 MED FILL — LEXISCAN 0.4 MG/5 ML INTRAVENOUS SYRINGE: 0.4 mg/5 mL | INTRAVENOUS | Qty: 5

## 2021-06-02 MED FILL — HYDRALAZINE 25 MG TAB: 25 mg | ORAL | Qty: 4

## 2021-06-02 MED FILL — METOPROLOL SUCCINATE SR 50 MG 24 HR TAB: 50 mg | ORAL | Qty: 2

## 2021-06-02 MED FILL — ASPIRIN 81 MG TAB, DELAYED RELEASE: 81 mg | ORAL | Qty: 1

## 2021-06-02 MED FILL — HEPARIN (PORCINE) IN D5W 25,000 UNIT/250 ML IV: 25000 unit/250 mL(100 unit/mL) | INTRAVENOUS | Qty: 250

## 2021-06-02 MED FILL — POTASSIUM CHLORIDE SR 10 MEQ TAB: 10 mEq | ORAL | Qty: 4

## 2021-06-02 NOTE — Progress Notes (Signed)
Admission Medication Reconciliation:    Information obtained from:  Patient    Comments/Recommendations: Reviewed PTA medications and patient's allergies.    Removed amlodipine 10 mg  Removed atorvastatin 40 mg  Removed clopidogrel 75 mg  Removed furosemide 40 mg  Removed hydralazine 100 mg  Removed novolog  Removed lisinopril 20 mg  Removed metoprolol succ 50 mg        Allergies:  Patient has no known allergies.    Significant PMH/Disease States:   Past Medical History:   Diagnosis Date    Chronic kidney disease, stage 3 (HCC)     Coronary artery disease involving native coronary artery of native heart without angina pectoris     CVA (cerebral vascular accident) (HCC)     Essential hypertension     Hyperlipidemia     Sequela of cerebrovascular accident     Type 2 diabetes mellitus (HCC)      Chief Complaint for this Admission:    Chief Complaint   Patient presents with    Irregular Heart Beat     Prior to Admission Medications:   Prior to Admission Medications   Prescriptions Last Dose Informant Patient Reported? Taking?   aspirin delayed-release 81 mg tablet  Self Yes Yes   Sig: Take 81 mg by mouth daily.   insulin detemir U-100 (LEVEMIR U-100 INSULIN) 100 unit/mL injection  Self No Yes   Sig: 15 Units by SubCUTAneous route nightly.   Patient taking differently: 10 Units by SubCUTAneous route nightly.      Facility-Administered Medications: None       Danielle Eppes

## 2021-06-02 NOTE — ED Notes (Signed)
Pt taken to Nuclear medicine for his ordered cardiac stress test.  Pt in no apparent distress at time of transport.

## 2021-06-02 NOTE — ED Notes (Signed)
Attempted to call report at this time.  Receiving nurse states that she is not willing to take report at this time as the room is not currently clean.

## 2021-06-02 NOTE — Progress Notes (Signed)
Reason for Admission:  Afib                     RUR Score:   11%                  Plan for utilizing home health:   No HH @ this time/uses no DME.        PCP: First and Last name:  Tammi Sou, NP     Name of Practice:    Are you a current patient: Yes/No: Yes   Approximate date of last visit: Seen years ago.    Can you participate in a virtual visit with your PCP: Yes/Call with assist.                     Current Advanced Directive/Advance Care Plan: No Order      Healthcare Decision Maker:     Primary Decision Maker: willouby,darlene - Daughter [phone]                  Transition of Care Plan:    D/C Plan is home with daughter & daughter to transport. Call Rx to [facility] in [place], in [place].

## 2021-06-02 NOTE — ACP (Advance Care Planning) (Signed)
Advance Care Planning   Healthcare Decision Maker:       Primary Decision Maker: Daniel Arellano - Daughter - [phone]

## 2021-06-02 NOTE — ED Notes (Signed)
Pt returned to room 7 at this time from nuclear medicine.  Reassumed care of pt at this time.

## 2021-06-02 NOTE — Consults (Signed)
Consults by Audrea Muscat, MD at 06/02/21 1059                Author: Audrea Muscat, MD  Service: Cardiology  Author Type: Physician       Filed: 06/02/21 1122  Date of Service: 06/02/21 1059  Status: Signed          Editor: Audrea Muscat, MD (Physician)               06/02/2021, 10:59 AM              [facility]   [address] [place] [address]   Phone: [phone]         Cardiology Consultation      06/02/2021, 10:59 AM         Daniel Arellano   80 y.o. male   [birth year]         Admit Date:  06/01/2021      Primary Cardiologist:  Dr. Prudencio Pair   Reason for consult: Atrial fibrillation with rapid ventricular response   Requesting provider: Dr. Clinton Sawyer        Impression:         1.  New onset atrial fibrillation with RVR which spontaneously has converted to sinus rhythm with rate control.   2.  Mildly elevated troponin which appears to be type II non-STEMI related to problem #1 and poor renal clearance.   3.  Hypokalemia         Plan:         1.  Continue metoprolol succinate for rate control of atrial fibrillation with anticoagulation by IV heparin which can be changed to Eliquis on  discharge.   2.  Continue aggressive blood pressure control using lisinopril and hydralazine along with beta-blocker.   3.  We will follow-up on echocardiogram ordered earlier for LV function and wall motion abnormalities   4.  Correct hypokalemia.   5.  Lexiscan stress Cardiolite scan to assess underlying myocardial ischemia due to patient's cardiac risk factors and elevated troponin.        Subjective        This is an 80 year old African-American man with a history of CVA, CAD, hypertension, type 2 diabetes mellitus, hypercholesterolemia and end-stage renal disease with hemodialysis dependency who was brought from the dialysis center for management of new  onset atrial fibrillation with RVR while receiving dialysis treatment.  Patient heart rate on presentation was in 140s.  For rate control  patient in the emergency room was treated with IV Lopressor and subsequently converted to sinus rhythm.  Patient's  peak troponin on this admission is 166.  Patient on presentation denied any complaints of chest pain, unusual dyspnea, palpitation, chest fluttering, dizziness or diaphoresis.  Patient denies prior history of atrial fibrillation.  He follows Dr. Prudencio Pair  in [place] for his cardiac issues on a yearly basis.           Past medical, Family & Social History     The following medical history was reviewed      Past Medical History:      Past Medical History:        Diagnosis  Date         ?  Chronic kidney disease, stage 3 (HCC)       ?  Coronary artery disease involving native coronary artery of native heart without angina pectoris       ?  CVA (cerebral vascular accident) [facility])       ?  Essential hypertension       ?  Hyperlipidemia       ?  Sequela of cerebrovascular accident           ?  Type 2 diabetes mellitus (HCC)           No past surgical history on file.      Social History:     Social History          Socioeconomic History         ?  Marital status:  SINGLE              Spouse name:  Not on file         ?  Number of children:  Not on file     ?  Years of education:  Not on file     ?  Highest education level:  Not on file       Occupational History        ?  Not on file       Tobacco Use         ?  Smoking status:  Never     ?  Smokeless tobacco:  Never       Substance and Sexual Activity         ?  Alcohol use:  Not Currently     ?  Drug use:  Never     ?  Sexual activity:  Not on file        Other Topics  Concern        ?  Not on file       Social History Narrative        ?  Not on file          Social Determinants of Health          Financial Resource Strain: Not on file     Food Insecurity: Not on file     Transportation Needs: Not on file     Physical Activity: Not on file     Stress: Not on file     Social Connections: Not on file     Intimate Partner Violence: Not on file       Housing  Stability: Not on file           Family History:      Family History         Problem  Relation  Age of Onset          ?  Diabetes  Mother       ?  Hypertension  Mother       ?  Diabetes  Father            ?  Hypertension  Father               Medications & Allergies        Allergies:    No Known Allergies      Home Medications:     Prior to Admission medications             Medication  Sig  Start Date  End Date  Taking?  Authorizing Provider            amLODIPine (NORVASC) 10 mg tablet  Take  by mouth daily.        Provider, Historical     clopidogrel (PLAVIX) 75 mg tab  Take  by mouth.  Provider, Historical     alcohol swabs (ALCOHOL PREP PADS) padm  by Apply Externally route.        Provider, Historical     aspirin delayed-release 81 mg tablet  Take  by mouth daily.        Provider, Historical     atorvastatin (LIPITOR) 40 mg tablet  Take  by mouth daily.        Provider, Historical     lisinopril (PRINIVIL, ZESTRIL) 20 mg tablet  Take  by mouth daily.        Provider, Historical     furosemide (LASIX) 40 mg tablet  Take  by mouth daily.        Provider, Historical     metoprolol succinate (TOPROL-XL) 100 mg tablet  Take  by mouth daily.        Provider, Historical     hydrALAZINE (APRESOLINE) 100 mg tablet  Take 100 mg by mouth three (3) times daily.        Provider, Historical     insulin aspart U-100 (NOVOLOG FLEXPEN U-100 INSULIN) 100 unit/mL (3 mL) inpn  4 Units by SubCUTAneous route Before breakfast and dinner.  10/07/18      Muthyala, Elayne Guerin, MD     Blood-Glucose Meter (FREESTYLE LITE METER) monitoring kit  To test sugars  10/07/18      Muthyala, Elayne Guerin, MD     glucose blood VI test strips (FREESTYLE LITE STRIPS) strip  Tests three times aday  10/07/18      Muthyala, Elayne Guerin, MD            insulin detemir U-100 (LEVEMIR U-100 INSULIN) 100 unit/mL injection  15 Units by SubCUTAneous route nightly.  10/07/18      Muthyala, Elayne Guerin, MD              REVIEW OF SYSTEMS      Review of Systems     Constitutional:  Negative for chills, fever and weight loss.    HENT:  Negative for congestion and sore throat.     Eyes:  Negative for blurred vision and double vision.    Respiratory:  Negative for cough, shortness of breath and wheezing.     Cardiovascular:  Negative for claudication and PND.    Gastrointestinal:  Negative for abdominal pain, blood in stool, nausea and vomiting.    Genitourinary:  Negative for dysuria and hematuria.    Musculoskeletal:  Negative for joint pain and myalgias.    Skin:  Negative for itching and rash.    Neurological:  Negative for dizziness, sensory change, focal weakness, loss of consciousness and headaches.    Endo/Heme/Allergies:  Negative for polydipsia. Does not bruise/bleed easily.    Psychiatric/Behavioral:  Negative for depression and hallucinations.              Objective             Vitals, I/O's, Weight        24 hour vital signs   BP  Min: 90/55  Max: 147/87   Temp  Avg: 98.4 ??F (36.9 ??C)  Min: 98.1 ??F (36.7 ??C)  Max: 98.6 ??F (37 ??C)   Capillary Refill could not be evaluated. This SmartLink does not work with rows of the type: Custom List   Resp  Avg: 15.9  Min: 13  Max: 18   SpO2  Avg: 98.5 %  Min: 97 %  Max: 295 %   Systolic (18ACZ), YSA:630 , Min:90 ,  BJY:782    Diastolic (95AOZ), HYQ:65, Min:52, Max:87      Body mass index is 22.24 kg/m??.   No intake or output data in the 24 hours ending 06/02/21 1059   Patient Vitals for the past 120 hrs:        Weight        06/01/21 1558  70.3 kg (155 lb)                 Admit weight: Weight: 70.3 kg (155 lb)           Exam              Constitutional  Generally well without symptoms, No weight loss, no fever, chills or nausea or vominting, and Well developed, well nourished in no apparent distress     HEENT  normal atraumatic, no neck masses, normal thyroid     Cardiovascular  regular heart rate, no murmurs, no JVD, S1 and S2 normal, no gallops notedboth carotids normal upstroke without bruits, radial pulses normal, pedal pulses  normal  both DP's and Pt's,  LE edema Trace     Pulmonary  clear to auscultation bilaterally     Abnominal  soft, non-tender, without masses or organomegaly, normal bowel sounds, no masses palpated     Genitourinary  Deferred     Muskuloskeletal  No obvious joint deformities.  No clubbing     Neuro  Alert and oriented     Psychiatric  Demonstrates good judgement and insight into his or her medical condition     Extremities  warm, well perfused        Skin  Warm and dry                Labs          Lab Results         Component  Value  Date/Time            WBC  4.4  06/01/2021 07:48 PM       HGB  11.2 (L)  06/01/2021 07:48 PM       HCT  34.4 (L)  06/01/2021 07:48 PM       PLATELET  166  06/01/2021 07:48 PM            MCV  90.1  06/01/2021 07:48 PM          Lab Results         Component  Value  Date/Time            Sodium  135 (L)  06/01/2021 04:04 PM       Potassium  3.1 (L)  06/01/2021 04:04 PM       Chloride  98  06/01/2021 04:04 PM       CO2  30  06/01/2021 04:04 PM       Anion gap  7  06/01/2021 04:04 PM       Glucose  121 (H)  06/01/2021 04:04 PM       BUN  21 (H)  06/01/2021 04:04 PM       Creatinine  5.45 (H)  06/01/2021 04:04 PM       BUN/Creatinine ratio  4 (L)  06/01/2021 04:04 PM       GFR est AA  12 (L)  06/01/2021 04:04 PM       GFR est non-AA  10 (L)  06/01/2021 04:04 PM  Calcium  10.2 (H)  06/01/2021 04:04 PM         Last Lipid:  No results found for: CHOL, CHOLX, CHLST, CHOLV, HDL, HDLP, LDL, LDLC, DLDLP, TGLX, TRIGL, TRIGP, CHHD, CHHDX   No results found for: CPK, RCK1, RCK2, RCK3, RCK4, CKMB, CKNDX, CKND1, TROPT, TROIQ, BNPP, BNP      Lab Results         Component  Value  Date/Time            TSH  0.02 (L)  06/01/2021 04:04 PM                 Imaging & Acillary Studies                XR CHEST PORT       Final Result     Cardiomegaly. Mild right basilar atelectasis. 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                       Echo: Pending         Total time spent:  60 minutes         Dr. Ulysees Barns. Greer Pickerel, MD.PhD. [facility]

## 2021-06-02 NOTE — ED Notes (Signed)
Handoff report sent to 4w. marcal took the phone call. Informed them I was aware that the bed was not clean and ready but wanted them to have time to review.

## 2021-06-03 LAB — EKG, 12 LEAD, SUBSEQUENT
Atrial Rate: 79 {beats}/min
Calculated P Axis: 37 degrees
Calculated R Axis: -14 degrees
Calculated T Axis: 37 degrees
P-R Interval: 176 ms
Q-T Interval: 410 ms
QRS Duration: 84 ms
QTC Calculation (Bezet): 470 ms
Ventricular Rate: 79 {beats}/min

## 2021-06-03 LAB — METABOLIC PANEL, COMPREHENSIVE
A-G Ratio: 1 — ABNORMAL LOW (ref 1.1–2.2)
ALT (SGPT): 14 U/L (ref 12–78)
AST (SGOT): 29 U/L (ref 15–37)
Albumin: 3 g/dL — ABNORMAL LOW (ref 3.5–5.0)
Alk. phosphatase: 66 U/L (ref 45–117)
Anion gap: 7 mmol/L (ref 5–15)
BUN/Creatinine ratio: 5 — ABNORMAL LOW (ref 12–20)
BUN: 45 mg/dL — ABNORMAL HIGH (ref 6–20)
Bilirubin, total: 0.4 mg/dL (ref 0.2–1.0)
CO2: 27 mmol/L (ref 21–32)
Calcium: 9.5 mg/dL (ref 8.5–10.1)
Chloride: 102 mmol/L (ref 97–108)
Creatinine: 9.5 mg/dL — ABNORMAL HIGH (ref 0.70–1.30)
GFR est AA: 6 mL/min/{1.73_m2} — ABNORMAL LOW (ref >60)
GFR est non-AA: 5 mL/min/{1.73_m2} — ABNORMAL LOW (ref >60)
Globulin: 3 g/dL (ref 2.0–4.0)
Glucose: 113 mg/dL — ABNORMAL HIGH (ref 65–100)
Potassium: 3.5 mmol/L (ref 3.5–5.1)
Protein, total: 6 g/dL — ABNORMAL LOW (ref 6.4–8.2)
Sodium: 136 mmol/L (ref 136–145)

## 2021-06-03 LAB — CBC W/O DIFF
ABSOLUTE NRBC: 0 10*3/uL (ref 0.00–0.01)
HCT: 33.5 % — ABNORMAL LOW (ref 36.6–50.3)
HGB: 11.1 g/dL — ABNORMAL LOW (ref 12.1–17.0)
MCH: 30.1 PG (ref 26.0–34.0)
MCHC: 33.1 g/dL (ref 30.0–36.5)
MCV: 90.8 FL (ref 80.0–99.0)
MPV: 11.2 FL (ref 8.9–12.9)
NRBC: 0 PER 100 WBC
PLATELET: 138 10*3/uL — ABNORMAL LOW (ref 150–400)
RBC: 3.69 M/uL — ABNORMAL LOW (ref 4.10–5.70)
RDW: 13.7 % (ref 11.5–14.5)
WBC: 4.6 10*3/uL (ref 4.1–11.1)

## 2021-06-03 LAB — GLUCOSE, POC
Glucose (POC): 84 mg/dL (ref 65–117)
Glucose (POC): 129 mg/dL — ABNORMAL HIGH (ref 65–117)

## 2021-06-03 LAB — PTT: aPTT: 54.9 s — ABNORMAL HIGH (ref 21.2–34.1)

## 2021-06-03 LAB — EKG 12-LEAD
Atrial Rate: 79 {beats}/min
P Axis: 37 degrees
P-R Interval: 176 ms
Q-T Interval: 410 ms
QRS Duration: 84 ms
QTc Calculation (Bazett): 470 ms
R Axis: -14 degrees
T Axis: 37 degrees
Ventricular Rate: 79 {beats}/min

## 2021-06-03 LAB — CBC
Hematocrit: 33.5 % — ABNORMAL LOW (ref 36.6–50.3)
Hemoglobin: 11.1 g/dL — ABNORMAL LOW (ref 12.1–17.0)
MCH: 30.1 PG (ref 26.0–34.0)
MCHC: 33.1 g/dL (ref 30.0–36.5)
MCV: 90.8 FL (ref 80.0–99.0)
MPV: 11.2 FL (ref 8.9–12.9)
NRBC Absolute: 0 10*3/uL (ref 0.00–0.01)
Nucleated RBCs: 0 PER 100 WBC
Platelets: 138 10*3/uL — ABNORMAL LOW (ref 150–400)
RBC: 3.69 M/uL — ABNORMAL LOW (ref 4.10–5.70)
RDW: 13.7 % (ref 11.5–14.5)
WBC: 4.6 10*3/uL (ref 4.1–11.1)

## 2021-06-03 LAB — COMPREHENSIVE METABOLIC PANEL
ALT: 14 U/L (ref 12–78)
AST: 29 U/L (ref 15–37)
Albumin/Globulin Ratio: 1 — ABNORMAL LOW (ref 1.1–2.2)
Albumin: 3 g/dL — ABNORMAL LOW (ref 3.5–5.0)
Alkaline Phosphatase: 66 U/L (ref 45–117)
Anion Gap: 7 mmol/L (ref 5–15)
BUN: 45 mg/dL — ABNORMAL HIGH (ref 6–20)
Bun/Cre Ratio: 5 — ABNORMAL LOW (ref 12–20)
CO2: 27 mmol/L (ref 21–32)
Calcium: 9.5 mg/dL (ref 8.5–10.1)
Chloride: 102 mmol/L (ref 97–108)
Creatinine: 9.5 mg/dL — ABNORMAL HIGH (ref 0.70–1.30)
EGFR IF NonAfrican American: 5 mL/min/{1.73_m2} — ABNORMAL LOW (ref >60)
GFR African American: 6 mL/min/{1.73_m2} — ABNORMAL LOW (ref >60)
Globulin: 3 g/dL (ref 2.0–4.0)
Glucose: 113 mg/dL — ABNORMAL HIGH (ref 65–100)
Potassium: 3.5 mmol/L (ref 3.5–5.1)
Sodium: 136 mmol/L (ref 136–145)
Total Bilirubin: 0.4 mg/dL (ref 0.2–1.0)
Total Protein: 6 g/dL — ABNORMAL LOW (ref 6.4–8.2)

## 2021-06-03 LAB — POCT GLUCOSE
POC Glucose: 84 mg/dL (ref 65–117)
POC Glucose: 129 mg/dL — ABNORMAL HIGH (ref 65–117)

## 2021-06-03 LAB — APTT: aPTT: 54.9 s — ABNORMAL HIGH (ref 21.2–34.1)

## 2021-06-03 MED ORDER — VERAPAMIL 2.5 MG/ML IV
2.5 mg/mL | INTRAVENOUS | Status: DC | PRN
Start: 2021-06-03 — End: 2021-06-03
  Administered 2021-06-03: 14:00:00 via INTRAVENOUS

## 2021-06-03 MED ORDER — HEPARIN (PORCINE) IN NS (PF) 1,000 UNIT/500 ML IV
1000 unit/500 mL | INTRAVENOUS | Status: AC
Start: 2021-06-03 — End: ?

## 2021-06-03 MED ORDER — ONDANSETRON (PF) 4 MG/2 ML INJECTION
4 mg/2 mL | Freq: Once | INTRAMUSCULAR | Status: AC | PRN
Start: 2021-06-03 — End: 2021-06-04

## 2021-06-03 MED ORDER — VERAPAMIL 2.5 MG/ML IV
2.5 mg/mL | INTRAVENOUS | Status: AC
Start: 2021-06-03 — End: ?

## 2021-06-03 MED ORDER — MIDAZOLAM 1 MG/ML IJ SOLN
1 mg/mL | INTRAMUSCULAR | Status: AC
Start: 2021-06-03 — End: ?

## 2021-06-03 MED ORDER — FENTANYL CITRATE (PF) 50 MCG/ML IJ SOLN
50 mcg/mL | INTRAMUSCULAR | Status: DC | PRN
Start: 2021-06-03 — End: 2021-06-03
  Administered 2021-06-03: 14:00:00 via INTRAVENOUS

## 2021-06-03 MED ORDER — HEPARIN (PORCINE) 1,000 UNIT/ML IJ SOLN
1000 unit/mL | INTRAMUSCULAR | Status: DC | PRN
Start: 2021-06-03 — End: 2021-06-03
  Administered 2021-06-03: 14:00:00 via INTRAVENOUS

## 2021-06-03 MED ORDER — SODIUM CHLORIDE 0.9 % IJ SYRG
INTRAMUSCULAR | Status: DC | PRN
Start: 2021-06-03 — End: 2021-06-04

## 2021-06-03 MED ORDER — SODIUM CHLORIDE 0.9 % IJ SYRG
Freq: Three times a day (TID) | INTRAMUSCULAR | Status: DC
Start: 2021-06-03 — End: 2021-06-04
  Administered 2021-06-03 – 2021-06-04 (×4): via INTRAVENOUS

## 2021-06-03 MED ORDER — NITROGLYCERIN 50 MG/10 ML (5 MG/ML) IV
50 mg/10 mL (5 mg/mL) | INTRAVENOUS | Status: DC | PRN
Start: 2021-06-03 — End: 2021-06-03
  Administered 2021-06-03: 14:00:00 via INTRAVENOUS

## 2021-06-03 MED ORDER — APIXABAN 2.5 MG TABLET
2.5 mg | Freq: Two times a day (BID) | ORAL | Status: DC
Start: 2021-06-03 — End: 2021-06-04
  Administered 2021-06-03 – 2021-06-04 (×3): via ORAL

## 2021-06-03 MED ORDER — MIDAZOLAM (PF) 1 MG/ML INJECTION SOLUTION
1 mg/mL | INTRAMUSCULAR | Status: DC | PRN
Start: 2021-06-03 — End: 2021-06-03
  Administered 2021-06-03: 14:00:00 via INTRAVENOUS

## 2021-06-03 MED ORDER — HEPARIN (PORCINE) 1,000 UNIT/ML IJ SOLN
1000 unit/mL | INTRAMUSCULAR | Status: AC
Start: 2021-06-03 — End: ?

## 2021-06-03 MED ORDER — LIDOCAINE HCL 1 % (10 MG/ML) IJ SOLN
10 mg/mL (1 %) | INTRAMUSCULAR | Status: DC | PRN
Start: 2021-06-03 — End: 2021-06-03
  Administered 2021-06-03: 14:00:00 via INTRADERMAL

## 2021-06-03 MED ORDER — METOPROLOL TARTRATE 5 MG/5 ML IV SOLN
5 mg/ mL | INTRAVENOUS | Status: AC
Start: 2021-06-03 — End: ?

## 2021-06-03 MED ORDER — FENTANYL CITRATE (PF) 50 MCG/ML IJ SOLN
50 mcg/mL | INTRAMUSCULAR | Status: AC
Start: 2021-06-03 — End: ?

## 2021-06-03 MED ORDER — NALOXONE 0.4 MG/ML INJECTION
0.4 mg/mL | INTRAMUSCULAR | Status: DC | PRN
Start: 2021-06-03 — End: 2021-06-04

## 2021-06-03 MED ORDER — LIDOCAINE HCL 1 % (10 MG/ML) IJ SOLN
10 mg/mL (1 %) | INTRAMUSCULAR | Status: AC
Start: 2021-06-03 — End: ?

## 2021-06-03 MED ORDER — ACETAMINOPHEN 325 MG TABLET
325 mg | ORAL | Status: DC | PRN
Start: 2021-06-03 — End: 2021-06-04

## 2021-06-03 MED FILL — HEPARIN (PORCINE) 1,000 UNIT/ML IJ SOLN: 1000 unit/mL | INTRAMUSCULAR | Qty: 10

## 2021-06-03 MED FILL — XYLOCAINE 10 MG/ML (1 %) INJECTION SOLUTION: 10 mg/mL (1 %) | INTRAMUSCULAR | Qty: 20

## 2021-06-03 MED FILL — SODIUM CHLORIDE 0.9 % IJ SYRG: INTRAMUSCULAR | Qty: 40

## 2021-06-03 MED FILL — ELIQUIS 2.5 MG TABLET: 2.5 mg | ORAL | Qty: 1

## 2021-06-03 MED FILL — HYDRALAZINE 50 MG TAB: 50 mg | ORAL | Qty: 2

## 2021-06-03 MED FILL — MIDAZOLAM 1 MG/ML IJ SOLN: 1 mg/mL | INTRAMUSCULAR | Qty: 2

## 2021-06-03 MED FILL — LANTUS U-100 INSULIN 100 UNIT/ML SUBCUTANEOUS SOLUTION: 100 unit/mL | SUBCUTANEOUS | Qty: 15

## 2021-06-03 MED FILL — ATORVASTATIN 40 MG TAB: 40 mg | ORAL | Qty: 1

## 2021-06-03 MED FILL — METOPROLOL SUCCINATE SR 25 MG 24 HR TAB: 25 mg | ORAL | Qty: 4

## 2021-06-03 MED FILL — METOPROLOL TARTRATE 5 MG/5 ML IV SOLN: 5 mg/ mL | INTRAVENOUS | Qty: 5

## 2021-06-03 MED FILL — HEPARIN (PORCINE) IN NS (PF) 1,000 UNIT/500 ML IV: 1000 unit/500 mL | INTRAVENOUS | Qty: 1500

## 2021-06-03 MED FILL — VERAPAMIL 2.5 MG/ML IV: 2.5 mg/mL | INTRAVENOUS | Qty: 2

## 2021-06-03 MED FILL — FENTANYL CITRATE (PF) 50 MCG/ML IJ SOLN: 50 mcg/mL | INTRAMUSCULAR | Qty: 2

## 2021-06-03 MED FILL — HEPARIN (PORCINE) IN D5W 25,000 UNIT/250 ML IV: 25000 unit/250 mL(100 unit/mL) | INTRAVENOUS | Qty: 250

## 2021-06-03 NOTE — Progress Notes (Signed)
Problem: Patient Education: Go to Patient Education Activity  Goal: Patient/Family Education  06/03/2021 0645 by John Giovanni, RN  Outcome: Progressing Towards Goal  06/03/2021 0645 by John Giovanni, RN  Outcome: Progressing Towards Goal     Problem: Activity Intolerance  Goal: *Oxygen saturation during activity within specified parameters  Outcome: Progressing Towards Goal  Goal: *Able to remain out of bed as prescribed  Outcome: Progressing Towards Goal     Problem: Patient Education: Go to Patient Education Activity  Goal: Patient/Family Education  Outcome: Progressing Towards Goal     Problem: Patient Education: Go to Patient Education Activity  Goal: Patient/Family Education  Outcome: Progressing Towards Goal

## 2021-06-03 NOTE — Progress Notes (Signed)
PROGRESS NOTE    Patient: Daniel Arellano MRN: [id number]  SSN: [id number]    Date of Birth: Feb 22, 1941  Age: 80 y.o.  Sex: male      Admit Date: 06/01/2021    LOS: 2 days       Subjective     CHIEF COMPLAINT:  Atrial fibrillation with RVR     HISTORY OF PRESENT ILLNESS:        Patient is a 80 y.o. year old male, with a past medical history of chronic kidney disease stage 3, cerebral vascular accident, CAD, HTN, hyperlipidemia, and Type 2 diabetes, arrived at the ED with atrial fibrillation in RVR. He was at the dialysis center when the staff noticed the irregular heart rate, which had not occurred before. He has a cardiologist, but does not have any heart conditions that he is aware of. Daniel Arellano attends dialysis on M, W, and Fridays. He denies chest pain, shortness of breath, and nausea and vomiting. He does not smoke and does not drink alcohol regularly.     Troponin 166  K 3.1  Creatinine 5.45  GFR 10     Chest XR  Impression: Cardiomegaly. Mild right basilar atelectasis. Suspect large hiatal hernia.     8/18  Patient awake alert resting in bed in NAD     ECHO pending.    8/19  Patient awake alert resting in bed in NAD    PTT 54.9  Creatinine 9.5  GFR 6, continues to decline    ECHO 8/18    Left Ventricle: Normal left ventricular systolic function with a visually estimated EF of 60 - 65%. Left ventricle is smaller than normal. Normal wall thickness. Findings consistent with mild concentric hypertrophy. Normal wall motion. Abnormal diastolic function. Normal left ventricular filling pressure.    Left Atrium: Left atrium is mildly dilated.    Trace MR and TR    Nuclear Cardiac Stress Test 8/18    Nuclear Findings: LVEF measures 50%. LV perfusion is probably abnormal. There is evidence of inducible ischemia.    Nuclear Findings: LVEF measures 50%. There is a mild severity left ventricular stress perfusion defect present in the apex segment(s) that is reversible. There is normal wall motion in the defect area. The defect  appears to be probable ischemia.    Nuclear Findings: Normal left ventricular systolic function post-stress. LVEF measures 50%.    Nuclear Findings: The study is most consistent with mild myocardial ischemia. LVEF measures 50%.    EKG 8/17  Atrial fibrillation   Left axis deviation   Nonspecific ST abnormality   Abnormal ECG   No previous ECGs available    Review of Systems:  Constitutional: Negative for chills and fever.   HENT: Negative.    Eyes: Negative.    Respiratory: Negative.    Cardiovascular: Negative.    Gastrointestinal: Negative for abdominal pain and nausea.   Skin: Negative.    Neurological: Negative.        Objective     Visit Vitals  BP (!) 112/56 (BP 1 Location: Right upper arm, BP Patient Position: At rest;Lying)   Pulse 74   Temp 97.3 ??F (36.3 ??C)   Resp 20   Ht 5' 10"  (1.778 m)   Wt 70.3 kg (155 lb)   SpO2 98%   BMI 22.24 kg/m??      O2 Device: None (Room air)    Physical Exam:   Constitutional: pt is oriented to person, place, and time.   HENT:  Head: Normocephalic and atraumatic.   Eyes: Pupils are equal, round, and reactive to light. EOM are normal.   Cardiovascular: Normal rate, regular rhythm and normal heart sounds.   Pulmonary/Chest: Breath sounds normal. No wheezes. No rales.   Exhibits no tenderness.   Abdominal: Soft. Bowel sounds are normal. There is no abdominal tenderness. There is no rebound and no guarding.   Musculoskeletal: Normal range of motion.   Neurological: pt is alert and oriented to person, place, and time. Alert. Normal strength. No cranial nerve deficit or sensory deficit.    Intake & Output:  Current Shift: No intake/output data recorded.  Last three shifts: No intake/output data recorded.    Lab/Data Review:  All lab results for the last 24 hours reviewed.       24 Hour Results:    Recent Results (from the past 24 hour(s))   NUCLEAR CARDIAC STRESS TEST    Collection Time: 06/02/21  3:00 PM   Result Value Ref Range    Stress Target HR 140 bpm    Baseline HR 76 bpm     Baseline Systolic BP 621 mmHg    Baseline Diastolic BP 65 mmHg    Stress Peak HR 119 bpm    Stress Percent HR Achieved 85 %    Stress Stage 1 Duration 1mn min:sec    Stress Stage 1 HR 87 bpm    Stress Stage 1 BP 115/61 mmHg    Stress Stage 2 Duration 285m min:sec    Stress Stage 2 HR 85 bpm    Stress Stage 3 Duration 6m61mmin:sec    Stress Stage 3 HR 86 bpm    Stress Stage 3 BP 123/58 mmHg    Stress Stage 4 Duration 4mi80min:sec    Stress Stage 4 HR 80 bpm    Stress Stage 5 Duration 5min37mn:sec    Nuc Stress EF 50 %   PTT    Collection Time: 06/02/21  4:18 PM   Result Value Ref Range    aPTT 52.8 (H) 21.2 - 34.1 sec    aPTT, therapeutic range   82 - 109 sec   CBC W/O DIFF    Collection Time: 06/03/21  1:37 AM   Result Value Ref Range    WBC 4.6 4.1 - 11.1 K/uL    RBC 3.69 (L) 4.10 - 5.70 M/uL    HGB 11.1 (L) 12.1 - 17.0 g/dL    HCT 33.5 (L) 36.6 - 50.3 %    MCV 90.8 80.0 - 99.0 FL    MCH 30.1 26.0 - 34.0 PG    MCHC 33.1 30.0 - 36.5 g/dL    RDW 13.7 11.5 - 14.5 %    PLATELET 138 (L) 150 - 400 K/uL    MPV 11.2 8.9 - 12.9 FL    NRBC 0.0 0.0 PER 100 WBC    ABSOLUTE NRBC 0.00 0.00 - 0.01 3.08   METABOLIC PANEL, COMPREHENSIVE    Collection Time: 06/03/21  1:37 AM   Result Value Ref Range    Sodium 136 136 - 145 mmol/L    Potassium 3.5 3.5 - 5.1 mmol/L    Chloride 102 97 - 108 mmol/L    CO2 27 21 - 32 mmol/L    Anion gap 7 5 - 15 mmol/L    Glucose 113 (H) 65 - 100 mg/dL    BUN 45 (H) 6 - 20 mg/dL    Creatinine 9.50 (H) 0.70 - 1.30 mg/dL    BUN/Creatinine ratio  5 (L) 12 - 20      GFR est AA 6 (L) >60 ml/min/1.6m    GFR est non-AA 5 (L) >60 ml/min/1.715m   Calcium 9.5 8.5 - 10.1 mg/dL    Bilirubin, total 0.4 0.2 - 1.0 mg/dL    AST (SGOT) 29 15 - 37 U/L    ALT (SGPT) 14 12 - 78 U/L    Alk. phosphatase 66 45 - 117 U/L    Protein, total 6.0 (L) 6.4 - 8.2 g/dL    Albumin 3.0 (L) 3.5 - 5.0 g/dL    Globulin 3.0 2.0 - 4.0 g/dL    A-G Ratio 1.0 (L) 1.1 - 2.2     PTT    Collection Time: 06/03/21  1:38 AM   Result Value Ref Range     aPTT 54.9 (H) 21.2 - 34.1 sec    aPTT, therapeutic range   82 - 109 sec   EKG, 12 LEAD, SUBSEQUENT    Collection Time: 06/03/21  9:20 AM   Result Value Ref Range    Ventricular Rate 79 BPM    Atrial Rate 79 BPM    P-R Interval 176 ms    QRS Duration 84 ms    Q-T Interval 410 ms    QTC Calculation (Bezet) 470 ms    Calculated P Axis 37 degrees    Calculated R Axis -14 degrees    Calculated T Axis 37 degrees    Diagnosis       Sinus rhythm with occasional Premature ventricular complexes  Otherwise normal ECG  Confirmed by JOSEPH MD, MATHEW ([id number] 06/03/2021 12:32:02 PM     GLUCOSE, POC    Collection Time: 06/03/21 10:37 AM   Result Value Ref Range    Glucose (POC) 84 65 - 117 mg/dL    Performed by [employer]ES[employer]on Palm        Imaging:    XR CHEST PORT   Final Result   Cardiomegaly. Mild right basilar atelectasis. Suspect large hiatal   hernia.                Assessment     Diastolic heart falure  Atrial fibrillation with RVR  Elevated troponin  End-stage renal disease on hemodialysis  CVA  CAD  HTN  Hyperlipidemia  Hypokalemia    Low systemic pressures due to intra-arterial vasodilators.  Patent left main, severely calcific LAD with mid to distal 50 to 60% disease and very distal LAD acid wraps around the left ventricular apex with up to 70% disease.  Nondominant and circumflex with nonobstructive disease.  Dominant right coronary artery with mid PDA branch with up to 50% disease.     Continue medical management and risk modification      Plan   Aspirin 81 mg POD  Lipitor 40 mg POD  Plavix 75 mg POD  Lasix 40 mg POD  Apresoline 100 mg 3 times daily  Prinivil 10 mg POD  Toprol-XL 100 mg POD  Lantus injection 15 units bedtime     Cussed with the cardiology plan for discharge tomorrow    Current Facility-Administered Medications:     sodium chloride (NS) flush 5-40 mL, 5-40 mL, IntraVENous, Q8H, JoVictorino DikeMD    sodium chloride (NS) flush 5-40 mL, 5-40 mL, IntraVENous, PRN, JoVictorino DikeMD    acetaminophen  (TYLENOL) tablet 650 mg, 650 mg, Oral, Q4H PRN, JoVictorino DikeMD    naloxone ([facility] 0.4 mg, 0.4 mg, IntraVENous, PRN, JoVictorino Dike  MD    ondansetron (ZOFRAN) injection 4 mg, 4 mg, IntraVENous, ONCE PRN, Victorino Dike, MD    apixaban [place]) tablet 2.5 mg, 2.5 mg, Oral, BID, Victorino Dike, MD    metoprolol (LOPRESSOR) injection 5 mg, 5 mg, IntraVENous, Q5MIN PRN, Jobani Sabado, MD, 5 mg at 06/01/21 1609    insulin glargine (LANTUS) injection 15 Units, 15 Units, SubCUTAneous, QHS, Bob Eastwood, MD, 15 Units at 06/02/21 2235    atorvastatin (LIPITOR) tablet 40 mg, 40 mg, Oral, DAILY, Claudetta Sallie, MD, 40 mg at 06/02/21 1114    furosemide (LASIX) tablet 40 mg, 40 mg, Oral, DAILY, Ruthella Kirchman, MD, 40 mg at 06/02/21 1114    hydrALAZINE (APRESOLINE) tablet 100 mg, 100 mg, Oral, TID, Joselyne Spake, MD, 100 mg at 06/02/21 2235    lisinopriL (PRINIVIL, ZESTRIL) tablet 10 mg, 10 mg, Oral, DAILY, Russia Scheiderer, MD    metoprolol succinate (TOPROL-XL) XL tablet 100 mg, 100 mg, Oral, DAILY, Mikka Kissner, MD    Current Outpatient Medications   Medication Instructions    aspirin delayed-release 81 mg, Oral, DAILY    insulin detemir U-100 (LEVEMIR U-100 INSULIN) 15 Units, SubCUTAneous, EVERY BEDTIME         Signed By: Glynn Octave, MD     June 03, 2021

## 2021-06-03 NOTE — Progress Notes (Signed)
Pt states no family to notify at this time

## 2021-06-03 NOTE — Progress Notes (Signed)
Chart reviewed.    DPC: home, self care     CM will continue to follow.

## 2021-06-03 NOTE — Progress Notes (Signed)
 Nutrition Assessment     Type and Reason for Visit: Positive nutrition screen (MST2)    Nutrition Recommendations/Plan:   Continue current diet  Monitor and record PO intakes and Bms in I/Os     Nutrition Assessment:  (P) Admitted for atrial fibrillation with rapid ventricular response. MST2 for ?wt loss- pt endorses a 5 lb weight loss since winter, not nutritionally significant. Intakes at baseline, >50%. Labs: Na 136, K 3.5, BUN 45, Creat 9.5, Gluc 113, Alb 3.0. Meds: atorvastatin , furosemide , insulin  glargine.    Malnutrition Assessment:  Malnutrition Status: No malnutrition     Nutrition Related Findings:  (P) n/a    Current Nutrition Therapies:  ADULT DIET Regular; Low Fat/Low Chol/High Fiber/NAS; No Salt Added (3-4 gm)    Anthropometric Measures:  Height:  (P) 5' 10 (177.8 cm)  Current Body Wt:  (P) 70.3 kg (154 lb 15.7 oz)  BMI: (P) 22.2    Nutrition Diagnosis:   (P) No nutrition diagnosis at this time    Nutrition Interventions:   Food and/or Nutrient Delivery: (P) Continue current diet  Nutrition Education/Counseling: (P) Education not indicated  Coordination of Nutrition Care: (P) Continue to monitor while inpatient    Nutrition Monitoring and Evaluation:   Behavioral-Environmental Outcomes: (P) None identified  Food/Nutrient Intake Outcomes: (P) Food and nutrient intake  Physical Signs/Symptoms Outcomes: (P) Meal time behavior, Weight    Discharge Planning:    (P) Too soon to determine    Albino Child, RD  Contact: [phone]

## 2021-06-03 NOTE — Consults (Signed)
Cardiology Consult    NAME: Daniel Arellano   DOB:  12-19-1940   MRN:  [id number]     Date/Time:  06/03/2021 8:54 AM    Patient PCP: Ardean Larsen, NP  ________________________________________________________________________     Assessment/Plan:   A. fib, back in sinus rhythm on telemetry, will obtain EKG. Will recommend therapy with Eliquis.    Coronary artery disease, LV apical ischemia, troponin leak mild on admission.  Cardiac cath versus medical management discussed with patient, he would like to find out and pursue catheterization    Chronic kidney disease, on hemodialysis for nearly 2 years    Diabetes mellitus    Hyperthyroidism, mildly elevated free T4, normal free T3 on beta-blockade.            []         High complexity decision making was performed        Subjective:   CHIEF COMPLAINT:   Atrial fibrillation    REASON FOR CONSULT:  A. fib    HISTORY OF PRESENT ILLNESS:     Daniel Arellano is a 80 y.o. BL[place] male who presents with atrial fibrillation.  Noted incidentally postdialysis.  Patient was sent to the emergency room, found with low TSH, has converted to sinus rhythm, continues beta-blockade.  Prior to admission patient was already on aspirin and Plavix, continues heparin.  Patient did undergo echocardiographic evaluation with normal left ventricular function without significant wall motion abnormalities, mild left atrial enlargement.  Cardiolite stress test with apical ischemia.  TSH is low.  Mildly elevated free T4, normal free T3.  Discussed with endocrinology, continue beta-blockade, low risk for cardiac cath.  Diabetes mellitus, longstanding.  End-stage renal disease, on hemodialysis for 2 years.    Without hypertension or hypercholesterolemia.  Denies tobacco use.  Denies family history.  Patient says he does 5 pounds with winter loses in summer.    Past Medical History:   Diagnosis Date    Chronic kidney disease, stage 3 (HCC)     Coronary artery disease involving native  coronary artery of native heart without angina pectoris     CVA (cerebral vascular accident) ([place])     Essential hypertension     Hyperlipidemia     Sequela of cerebrovascular accident     Type 2 diabetes mellitus ([place])       No past surgical history on file.  No Known Allergies   Meds:  See below  Social History     Tobacco Use    Smoking status: Never    Smokeless tobacco: Never   Substance Use Topics    Alcohol use: Not Currently      Family History   Problem Relation Age of Onset    Diabetes Mother     Hypertension Mother     Diabetes Father     Hypertension Father        REVIEW OF SYSTEMS:     []          Unable to obtain  ROS due to ---   [x]          Total of 12 systems reviewed as follows:    Constitutional: negative fever, negative chills, negative weight loss  Eyes:   negative visual changes  ENT:   negative sore throat, tongue or lip swelling  Respiratory:  negative cough, negative dyspnea  Cards:              see HPI  GI:   negative for nausea, vomiting,  diarrhea, and abdominal pain  Genitourinary: negative for frequency, dysuria  Integument:  negative for rash   Hematologic:  negative for easy bruising and gum/nose bleeding  Musculoskel: negative for myalgias,  back pain  Neurological:  negative for headaches, dizziness, vertigo, weakness  Behavl/Psych: negative for feelings of anxiety, depression     Pertinent Positives include :    Objective:      Physical Exam:    Last 24hrs VS reviewed since prior progress note. Most recent are:    Visit Vitals  BP 122/65 (BP 1 Location: Right upper arm, BP Patient Position: At rest;Lying)   Pulse 82   Temp 98.7 ??F (37.1 ??C)   Resp 18   Ht 5' 10"  (1.778 m)   Wt 70.3 kg (155 lb)   SpO2 99%   BMI 22.24 kg/m??     No intake or output data in the 24 hours ending 06/03/21 0854     General Appearance: Well developed, alert, no acute distress.  Ears/Nose/Mouth/Throat: Pupils equal and round, Hearing grossly normal.  Neck: Supple.  JVP within normal limits. Carotids good  upstrokes, with no bruit.  No goiter  Chest: Lungs clear to auscultation bilaterally.  Cardiovascular: Regular rate and rhythm, S1S2 normal, 2/6 systolic murmur, no  rubs, gallops.  Abdomen: Soft, non-tender, bowel sounds are active. No organomegaly.  Extremities: No edema bilaterally. Femoral pulses +2, Distal Pulses +1.  Skin: Warm and dry.  Neuro: Alert and oriented x3, normal speech; follows simple commands  Psychiatric: Cooperative, normal affect and judgment    []          Post-cath site without hematoma, bruit, tenderness, or thrill.  Distal pulses intact.    Data:      Telemetry:    EKG:  []   No new EKG for review.        Prior to Admission medications    Medication Sig Start Date End Date Taking? Authorizing Provider   aspirin delayed-release 81 mg tablet Take 81 mg by mouth daily.   Yes Provider, Historical   insulin detemir U-100 (LEVEMIR U-100 INSULIN) 100 unit/mL injection 15 Units by SubCUTAneous route nightly.  Patient taking differently: 10 Units by SubCUTAneous route nightly. 10/07/18  Yes Muthyala, Elayne Guerin, MD       Recent Results (from the past 24 hour(s))   ECHO ADULT COMPLETE    Collection Time: 06/02/21 10:30 AM   Result Value Ref Range    LV EDV A4C 110 mL    LV ESV A4C 39 mL    IVSd 1.2 (A) 0.6 - 1.0 cm    LVIDd 3.7 (A) 4.2 - 5.9 cm    LVIDs 2.4 cm    LVOT Diameter 2.0 cm    LVOT Peak Velocity 1.4 m/s    LVOT Peak Gradient 7 mmHg    LVPWd 1.1 (A) 0.6 - 1.0 cm    LV E' Lateral Velocity 8 cm/s    LV E' Septal Velocity 7 cm/s    LV Ejection Fraction A4C 65 %    LVOT Area 3.1 cm2    LA Major Axis 6.3 cm    LA Area 4C 20.9 cm2    LA Diameter 4.0 cm    AV Peak Velocity 1.8 m/s    AV Peak Gradient 12 mmHg    AV Area by Peak Velocity 2.4 cm2    Aortic Root 3.2 cm    Ascending Aorta 3.2 cm    Descending Aorta 2.5 cm    MR Peak  Velocity 1.4 m/s    MR Peak Gradient 8 mmHg    MV Max Velocity 1.3 m/s    MV Peak Gradient 7 mmHg    MV E Wave Deceleration Time 215.0 ms    MV A Velocity 1.20 m/s    MV E  Velocity 0.78 m/s    MV Mean Gradient 3 mmHg    MV VTI 35.4 cm    MV Mean Velocity 0.8 m/s    Est. RA Pressure 3 mmHg    TR Max Velocity 2.69 m/s    TR Peak Gradient 29 mmHg    TAPSE 2.0 1.7 cm    Fractional Shortening 2D 35 28 - 44 %    LV ESV Index A4C 21 mL/m2    LV EDV Index A4C 59 mL/m2    LVIDd Index 1.98 cm/m2    LVIDs Index 1.28 cm/m2    LV RWT Ratio 0.59     LV Mass 2D 138.2 88 - 224 g    LV Mass 2D Index 73.9 49 - 115 g/m2    MV E/A 0.65     E/E' Ratio (Averaged) 10.45     E/E' Lateral 9.75     E/E' Septal 11.14     LA Size Index 2.14 cm/m2    LA/AO Root Ratio 1.25     Ao Root Index 1.71 cm/m2    Ascending Aorta Index 1.71 cm/m2    AV Velocity Ratio 0.78     AVA/BSA Peak Velocity 1.3 cm2/m2    RVSP 32 mmHg    Descending Aorta Index 1.34 cm/m2    LV Ejection Fraction A2C 65 %   PTT    Collection Time: 06/02/21 11:23 AM   Result Value Ref Range    aPTT 70.3 (H) 21.2 - 34.1 sec    aPTT, therapeutic range   82 - 109 sec   NUCLEAR CARDIAC STRESS TEST    Collection Time: 06/02/21  3:00 PM   Result Value Ref Range    Stress Target HR 140 bpm    Baseline HR 76 bpm    Baseline Systolic BP 630 mmHg    Baseline Diastolic BP 65 mmHg    Stress Peak HR 119 bpm    Stress Percent HR Achieved 85 %    Stress Stage 1 Duration 88mn min:sec    Stress Stage 1 HR 87 bpm    Stress Stage 1 BP 115/61 mmHg    Stress Stage 2 Duration 274m min:sec    Stress Stage 2 HR 85 bpm    Stress Stage 3 Duration 13m55mmin:sec    Stress Stage 3 HR 86 bpm    Stress Stage 3 BP 123/58 mmHg    Stress Stage 4 Duration 4mi62min:sec    Stress Stage 4 HR 80 bpm    Stress Stage 5 Duration 5min38mn:sec    Nuc Stress EF 50 %   PTT    Collection Time: 06/02/21  4:18 PM   Result Value Ref Range    aPTT 52.8 (H) 21.2 - 34.1 sec    aPTT, therapeutic range   82 - 109 sec   CBC W/O DIFF    Collection Time: 06/03/21  1:37 AM   Result Value Ref Range    WBC 4.6 4.1 - 11.1 K/uL    RBC 3.69 (L) 4.10 - 5.70 M/uL    HGB 11.1 (L) 12.1 - 17.0 g/dL    HCT 33.5 (L) 36.6 -  50.3 %  MCV 90.8 80.0 - 99.0 FL    MCH 30.1 26.0 - 34.0 PG    MCHC 33.1 30.0 - 36.5 g/dL    RDW 13.7 11.5 - 14.5 %    PLATELET 138 (L) 150 - 400 K/uL    MPV 11.2 8.9 - 12.9 FL    NRBC 0.0 0.0 PER 100 WBC    ABSOLUTE NRBC 0.00 0.00 - 3.29 K/uL   METABOLIC PANEL, COMPREHENSIVE    Collection Time: 06/03/21  1:37 AM   Result Value Ref Range    Sodium 136 136 - 145 mmol/L    Potassium 3.5 3.5 - 5.1 mmol/L    Chloride 102 97 - 108 mmol/L    CO2 27 21 - 32 mmol/L    Anion gap 7 5 - 15 mmol/L    Glucose 113 (H) 65 - 100 mg/dL    BUN 45 (H) 6 - 20 mg/dL    Creatinine 9.50 (H) 0.70 - 1.30 mg/dL    BUN/Creatinine ratio 5 (L) 12 - 20      GFR est AA 6 (L) >60 ml/min/1.35m    GFR est non-AA 5 (L) >60 ml/min/1.727m   Calcium 9.5 8.5 - 10.1 mg/dL    Bilirubin, total 0.4 0.2 - 1.0 mg/dL    AST (SGOT) 29 15 - 37 U/L    ALT (SGPT) 14 12 - 78 U/L    Alk. phosphatase 66 45 - 117 U/L    Protein, total 6.0 (L) 6.4 - 8.2 g/dL    Albumin 3.0 (L) 3.5 - 5.0 g/dL    Globulin 3.0 2.0 - 4.0 g/dL    A-G Ratio 1.0 (L) 1.1 - 2.2     PTT    Collection Time: 06/03/21  1:38 AM   Result Value Ref Range    aPTT 54.9 (H) 21.2 - 34.1 sec    aPTT, therapeutic range   82 - 109 sec        MaVictorino DikeMD

## 2021-06-03 NOTE — Progress Notes (Deleted)
PROGRESS NOTE    Patient: Daniel Arellano MRN: [id number]  SSN: [id number]    Date of Birth: 08/11/1941  Age: 80 y.o.  Sex: male      Admit Date: 06/01/2021    LOS: 2 days       Subjective     CHIEF COMPLAINT:  Atrial fibrillation with RVR     HISTORY OF PRESENT ILLNESS:        Patient is a 80 y.o. year old male, with a past medical history of chronic kidney disease stage 3, cerebral vascular accident, CAD, HTN, hyperlipidemia, and Type 2 diabetes, arrived at the ED with atrial fibrillation in RVR. He was at the dialysis center when the staff noticed the irregular heart rate, which had not occurred before. He has a cardiologist, but does not have any heart conditions that he is aware of. Daniel Arellano attends dialysis on M, W, and Fridays. He denies chest pain, shortness of breath, and nausea and vomiting. He does not smoke and does not drink alcohol regularly.     Troponin 166  K 3.1  Creatinine 5.45  GFR 10     Chest XR  Impression: Cardiomegaly. Mild right basilar atelectasis. Suspect large hiatal hernia.     8/18  Patient awake alert resting in bed in NAD     ECHO pending.    8/19  Patient awake alert resting in bed in NAD    PTT 54.9  Creatinine 9.5  GFR 6, continues to decline    ECHO 8/18    Left Ventricle: Normal left ventricular systolic function with a visually estimated EF of 60 - 65%. Left ventricle is smaller than normal. Normal wall thickness. Findings consistent with mild concentric hypertrophy. Normal wall motion. Abnormal diastolic function. Normal left ventricular filling pressure.    Left Atrium: Left atrium is mildly dilated.    Trace MR and TR    Nuclear Cardiac Stress Test 8/18    Nuclear Findings: LVEF measures 50%. LV perfusion is probably abnormal. There is evidence of inducible ischemia.    Nuclear Findings: LVEF measures 50%. There is a mild severity left ventricular stress perfusion defect present in the apex segment(s) that is reversible. There is normal wall motion in the defect area. The defect  appears to be probable ischemia.    Nuclear Findings: Normal left ventricular systolic function post-stress. LVEF measures 50%.    Nuclear Findings: The study is most consistent with mild myocardial ischemia. LVEF measures 50%.    EKG 8/17  Atrial fibrillation   Left axis deviation   Nonspecific ST abnormality   Abnormal ECG   No previous ECGs available    Review of Systems:  Constitutional: Negative for chills and fever.   HENT: Negative.    Eyes: Negative.    Respiratory: Negative.    Cardiovascular: Negative.    Gastrointestinal: Negative for abdominal pain and nausea.   Skin: Negative.    Neurological: Negative.        Objective     Visit Vitals  BP 122/65 (BP 1 Location: Right upper arm, BP Patient Position: At rest;Lying)   Pulse 82   Temp 98.7 F (37.1 C)   Resp 18   Ht 5' 10"  (1.778 m)   Wt 70.3 kg (155 lb)   SpO2 99%   BMI 22.24 kg/m      O2 Device: None (Room air)    Physical Exam:   Constitutional: pt is oriented to person, place, and time.   HENT:  Head: Normocephalic and atraumatic.   Eyes: Pupils are equal, round, and reactive to light. EOM are normal.   Cardiovascular: Normal rate, regular rhythm and normal heart sounds.   Pulmonary/Chest: Breath sounds normal. No wheezes. No rales.   Exhibits no tenderness.   Abdominal: Soft. Bowel sounds are normal. There is no abdominal tenderness. There is no rebound and no guarding.   Musculoskeletal: Normal range of motion.   Neurological: pt is alert and oriented to person, place, and time. Alert. Normal strength. No cranial nerve deficit or sensory deficit.    Intake & Output:  Current Shift: No intake/output data recorded.  Last three shifts: No intake/output data recorded.    Lab/Data Review:  All lab results for the last 24 hours reviewed.       24 Hour Results:    Recent Results (from the past 24 hour(s))   ECHO ADULT COMPLETE    Collection Time: 06/02/21 10:30 AM   Result Value Ref Range    LV EDV A4C 110 mL    LV ESV A4C 39 mL    IVSd 1.2 (A) 0.6 -  1.0 cm    LVIDd 3.7 (A) 4.2 - 5.9 cm    LVIDs 2.4 cm    LVOT Diameter 2.0 cm    LVOT Peak Velocity 1.4 m/s    LVOT Peak Gradient 7 mmHg    LVPWd 1.1 (A) 0.6 - 1.0 cm    LV E' Lateral Velocity 8 cm/s    LV E' Septal Velocity 7 cm/s    LV Ejection Fraction A4C 65 %    LVOT Area 3.1 cm2    LA Major Axis 6.3 cm    LA Area 4C 20.9 cm2    LA Diameter 4.0 cm    AV Peak Velocity 1.8 m/s    AV Peak Gradient 12 mmHg    AV Area by Peak Velocity 2.4 cm2    Aortic Root 3.2 cm    Ascending Aorta 3.2 cm    Descending Aorta 2.5 cm    MR Peak Velocity 1.4 m/s    MR Peak Gradient 8 mmHg    MV Max Velocity 1.3 m/s    MV Peak Gradient 7 mmHg    MV E Wave Deceleration Time 215.0 ms    MV A Velocity 1.20 m/s    MV E Velocity 0.78 m/s    MV Mean Gradient 3 mmHg    MV VTI 35.4 cm    MV Mean Velocity 0.8 m/s    Est. RA Pressure 3 mmHg    TR Max Velocity 2.69 m/s    TR Peak Gradient 29 mmHg    TAPSE 2.0 1.7 cm    Fractional Shortening 2D 35 28 - 44 %    LV ESV Index A4C 21 mL/m2    LV EDV Index A4C 59 mL/m2    LVIDd Index 1.98 cm/m2    LVIDs Index 1.28 cm/m2    LV RWT Ratio 0.59     LV Mass 2D 138.2 88 - 224 g    LV Mass 2D Index 73.9 49 - 115 g/m2    MV E/A 0.65     E/E' Ratio (Averaged) 10.45     E/E' Lateral 9.75     E/E' Septal 11.14     LA Size Index 2.14 cm/m2    LA/AO Root Ratio 1.25     Ao Root Index 1.71 cm/m2    Ascending Aorta Index 1.71 cm/m2    AV Velocity  Ratio 0.78     AVA/BSA Peak Velocity 1.3 cm2/m2    RVSP 32 mmHg    Descending Aorta Index 1.34 cm/m2    LV Ejection Fraction A2C 65 %   PTT    Collection Time: 06/02/21 11:23 AM   Result Value Ref Range    aPTT 70.3 (H) 21.2 - 34.1 sec    aPTT, therapeutic range   82 - 109 sec   NUCLEAR CARDIAC STRESS TEST    Collection Time: 06/02/21  3:00 PM   Result Value Ref Range    Stress Target HR 140 bpm    Baseline HR 76 bpm    Baseline Systolic BP 244 mmHg    Baseline Diastolic BP 65 mmHg    Stress Peak HR 119 bpm    Stress Percent HR Achieved 85 %    Stress Stage 1 Duration 20mn  min:sec    Stress Stage 1 HR 87 bpm    Stress Stage 1 BP 115/61 mmHg    Stress Stage 2 Duration 242m min:sec    Stress Stage 2 HR 85 bpm    Stress Stage 3 Duration 40m78mmin:sec    Stress Stage 3 HR 86 bpm    Stress Stage 3 BP 123/58 mmHg    Stress Stage 4 Duration 4mi78min:sec    Stress Stage 4 HR 80 bpm    Stress Stage 5 Duration 5min55mn:sec    Nuc Stress EF 50 %   PTT    Collection Time: 06/02/21  4:18 PM   Result Value Ref Range    aPTT 52.8 (H) 21.2 - 34.1 sec    aPTT, therapeutic range   82 - 109 sec   CBC W/O DIFF    Collection Time: 06/03/21  1:37 AM   Result Value Ref Range    WBC 4.6 4.1 - 11.1 K/uL    RBC 3.69 (L) 4.10 - 5.70 M/uL    HGB 11.1 (L) 12.1 - 17.0 g/dL    HCT 33.5 (L) 36.6 - 50.3 %    MCV 90.8 80.0 - 99.0 FL    MCH 30.1 26.0 - 34.0 PG    MCHC 33.1 30.0 - 36.5 g/dL    RDW 13.7 11.5 - 14.5 %    PLATELET 138 (L) 150 - 400 K/uL    MPV 11.2 8.9 - 12.9 FL    NRBC 0.0 0.0 PER 100 WBC    ABSOLUTE NRBC 0.00 0.00 - 0.01 0.10   METABOLIC PANEL, COMPREHENSIVE    Collection Time: 06/03/21  1:37 AM   Result Value Ref Range    Sodium 136 136 - 145 mmol/L    Potassium 3.5 3.5 - 5.1 mmol/L    Chloride 102 97 - 108 mmol/L    CO2 27 21 - 32 mmol/L    Anion gap 7 5 - 15 mmol/L    Glucose 113 (H) 65 - 100 mg/dL    BUN 45 (H) 6 - 20 mg/dL    Creatinine 9.50 (H) 0.70 - 1.30 mg/dL    BUN/Creatinine ratio 5 (L) 12 - 20      GFR est AA 6 (L) >60 ml/min/1.740m2 52mFR est non-AA 5 (L) >60 ml/min/1.740m2  39mlcium 9.5 8.5 - 10.1 mg/dL    Bilirubin, total 0.4 0.2 - 1.0 mg/dL    AST (SGOT) 29 15 - 37 U/L    ALT (SGPT) 14 12 - 78 U/L    Alk. phosphatase 66 45 - 117 U/L  Protein, total 6.0 (L) 6.4 - 8.2 g/dL    Albumin 3.0 (L) 3.5 - 5.0 g/dL    Globulin 3.0 2.0 - 4.0 g/dL    A-G Ratio 1.0 (L) 1.1 - 2.2     PTT    Collection Time: 06/03/21  1:38 AM   Result Value Ref Range    aPTT 54.9 (H) 21.2 - 34.1 sec    aPTT, therapeutic range   82 - 109 sec         Imaging:    XR CHEST PORT   Final Result   Cardiomegaly. Mild right  basilar atelectasis. Suspect large hiatal   hernia.                Assessment     Diastolic heart falure  Atrial fibrillation with RVR  Elevated troponin  End-stage renal disease on hemodialysis  CVA  CAD  HTN  Hyperlipidemia  Hypokalemia      Plan   Aspirin 81 mg POD  Lipitor 40 mg POD  Plavix 75 mg POD  Lasix 40 mg POD  Apresoline 100 mg 3 times daily  Prinivil 10 mg POD  Toprol-XL 100 mg POD  Lantus injection 15 units bedtime     Continue heparin drip      Current Facility-Administered Medications:     metoprolol (LOPRESSOR) injection 5 mg, 5 mg, IntraVENous, Q5MIN PRN, Mohiuddin, Abdul, MD, 5 mg at 06/01/21 1609    insulin glargine (LANTUS) injection 15 Units, 15 Units, SubCUTAneous, QHS, Mohiuddin, Abdul, MD, 15 Units at 06/02/21 2235    aspirin delayed-release tablet 81 mg, 81 mg, Oral, DAILY, Mohiuddin, Abdul, MD, 81 mg at 06/02/21 1114    atorvastatin (LIPITOR) tablet 40 mg, 40 mg, Oral, DAILY, Mohiuddin, Abdul, MD, 40 mg at 06/02/21 1114    clopidogreL (PLAVIX) tablet 75 mg, 75 mg, Oral, DAILY, Mohiuddin, Abdul, MD, 75 mg at 06/02/21 1114    furosemide (LASIX) tablet 40 mg, 40 mg, Oral, DAILY, Mohiuddin, Abdul, MD, 40 mg at 06/02/21 1114    hydrALAZINE (APRESOLINE) tablet 100 mg, 100 mg, Oral, TID, Mohiuddin, Abdul, MD, 100 mg at 06/02/21 2235    lisinopriL (PRINIVIL, ZESTRIL) tablet 10 mg, 10 mg, Oral, DAILY, Mohiuddin, Marcy Siren, MD    metoprolol succinate (TOPROL-XL) XL tablet 100 mg, 100 mg, Oral, DAILY, Mohiuddin, Abdul, MD    heparin 25,000 units in D5W 250 ml infusion, 12-25 Units/kg/hr, IntraVENous, TITRATE, Mohiuddin, Abdul, MD, Last Rate: 14.1 mL/hr at 06/03/21 0434, 20 Units/kg/hr at 06/03/21 0434    heparin (porcine) 1,000 unit/mL injection 4,000 Units, 4,000 Units, IntraVENous, PRN **OR** heparin (porcine) 1,000 unit/mL injection 2,000 Units, 2,000 Units, IntraVENous, PRN, Mohiuddin, Abdul, MD, 2,000 Units at 06/03/21 0435    Current Outpatient Medications   Medication Instructions    aspirin  delayed-release 81 mg, Oral, DAILY    insulin detemir U-100 (LEVEMIR U-100 INSULIN) 15 Units, SubCUTAneous, EVERY BEDTIME         Signed By: Mortimer Fries     June 03, 2021

## 2021-06-03 NOTE — Progress Notes (Signed)
Problem: Patient Education: Go to Patient Education Activity  Goal: Patient/Family Education  Outcome: Progressing Towards Goal     Problem: Activity Intolerance  Goal: *Oxygen saturation during activity within specified parameters  Outcome: Progressing Towards Goal  Goal: *Able to remain out of bed as prescribed  Outcome: Progressing Towards Goal     Problem: Patient Education: Go to Patient Education Activity  Goal: Patient/Family Education  Outcome: Progressing Towards Goal

## 2021-06-04 LAB — LIPID PANEL
CHOL/HDL Ratio: 5.4 — ABNORMAL HIGH (ref 0.0–5.0)
Chol/HDL Ratio: 5.4 — ABNORMAL HIGH (ref 0.0–5.0)
Cholesterol, Total: 107 mg/dL (ref <200)
Cholesterol, total: 107 mg/dL (ref <200)
HDL Cholesterol: 20 mg/dL
HDL: 20 mg/dL
LDL Calculated: 47.8 mg/dL (ref 0–100)
LDL, calculated: 47.8 mg/dL (ref 0–100)
Triglyceride: 196 mg/dL — ABNORMAL HIGH (ref <150)
Triglycerides: 196 mg/dL — ABNORMAL HIGH (ref <150)
VLDL Cholesterol Calculated: 39.2 mg/dL
VLDL, calculated: 39.2 mg/dL

## 2021-06-04 LAB — METABOLIC PANEL, COMPREHENSIVE
A-G Ratio: 1.1 (ref 1.1–2.2)
ALT (SGPT): 15 U/L (ref 12–78)
AST (SGOT): 25 U/L (ref 15–37)
Albumin: 2.9 g/dL — ABNORMAL LOW (ref 3.5–5.0)
Alk. phosphatase: 69 U/L (ref 45–117)
Anion gap: 10 mmol/L (ref 5–15)
BUN/Creatinine ratio: 5 — ABNORMAL LOW (ref 12–20)
BUN: 61 mg/dL — ABNORMAL HIGH (ref 6–20)
Bilirubin, total: 0.3 mg/dL (ref 0.2–1.0)
CO2: 26 mmol/L (ref 21–32)
Calcium: 9.4 mg/dL (ref 8.5–10.1)
Chloride: 102 mmol/L (ref 97–108)
Creatinine: 12.5 mg/dL — ABNORMAL HIGH (ref 0.70–1.30)
GFR est AA: 5 mL/min/{1.73_m2} — ABNORMAL LOW (ref >60)
GFR est non-AA: 4 mL/min/{1.73_m2} — ABNORMAL LOW (ref >60)
Globulin: 2.7 g/dL (ref 2.0–4.0)
Glucose: 115 mg/dL — ABNORMAL HIGH (ref 65–100)
Potassium: 3.8 mmol/L (ref 3.5–5.1)
Protein, total: 5.6 g/dL — ABNORMAL LOW (ref 6.4–8.2)
Sodium: 138 mmol/L (ref 136–145)

## 2021-06-04 LAB — GLUCOSE, POC
Glucose (POC): 96 mg/dL (ref 65–117)
Glucose (POC): 99 mg/dL (ref 65–117)
Glucose (POC): 98 mg/dL (ref 65–117)

## 2021-06-04 LAB — COMPREHENSIVE METABOLIC PANEL
ALT: 15 U/L (ref 12–78)
AST: 25 U/L (ref 15–37)
Albumin/Globulin Ratio: 1.1 (ref 1.1–2.2)
Albumin: 2.9 g/dL — ABNORMAL LOW (ref 3.5–5.0)
Alkaline Phosphatase: 69 U/L (ref 45–117)
Anion Gap: 10 mmol/L (ref 5–15)
BUN: 61 mg/dL — ABNORMAL HIGH (ref 6–20)
Bun/Cre Ratio: 5 — ABNORMAL LOW (ref 12–20)
CO2: 26 mmol/L (ref 21–32)
Calcium: 9.4 mg/dL (ref 8.5–10.1)
Chloride: 102 mmol/L (ref 97–108)
Creatinine: 12.5 mg/dL — ABNORMAL HIGH (ref 0.70–1.30)
EGFR IF NonAfrican American: 4 mL/min/{1.73_m2} — ABNORMAL LOW (ref >60)
GFR African American: 5 mL/min/{1.73_m2} — ABNORMAL LOW (ref >60)
Globulin: 2.7 g/dL (ref 2.0–4.0)
Glucose: 115 mg/dL — ABNORMAL HIGH (ref 65–100)
Potassium: 3.8 mmol/L (ref 3.5–5.1)
Sodium: 138 mmol/L (ref 136–145)
Total Bilirubin: 0.3 mg/dL (ref 0.2–1.0)
Total Protein: 5.6 g/dL — ABNORMAL LOW (ref 6.4–8.2)

## 2021-06-04 LAB — POCT GLUCOSE
POC Glucose: 96 mg/dL (ref 65–117)
POC Glucose: 99 mg/dL (ref 65–117)
POC Glucose: 98 mg/dL (ref 65–117)

## 2021-06-04 MED ORDER — ATORVASTATIN 40 MG TAB
40 mg | ORAL_TABLET | Freq: Every day | ORAL | 0 refills | Status: AC
Start: 2021-06-04 — End: ?

## 2021-06-04 MED ORDER — METOPROLOL SUCCINATE SR 100 MG 24 HR TAB
100 mg | ORAL_TABLET | Freq: Every day | ORAL | 0 refills | Status: AC
Start: 2021-06-04 — End: ?

## 2021-06-04 MED ORDER — APIXABAN 2.5 MG TABLET
2.5 mg | ORAL_TABLET | Freq: Two times a day (BID) | ORAL | 0 refills | Status: AC
Start: 2021-06-04 — End: ?

## 2021-06-04 MED ORDER — LISINOPRIL 10 MG TAB
10 mg | ORAL_TABLET | Freq: Every day | ORAL | 0 refills | Status: AC
Start: 2021-06-04 — End: ?

## 2021-06-04 MED ORDER — FUROSEMIDE 40 MG TAB
40 mg | ORAL_TABLET | Freq: Every day | ORAL | 0 refills | Status: AC
Start: 2021-06-04 — End: ?

## 2021-06-04 MED FILL — HYDRALAZINE 50 MG TAB: 50 mg | ORAL | Qty: 2

## 2021-06-04 MED FILL — FUROSEMIDE 40 MG TAB: 40 mg | ORAL | Qty: 1

## 2021-06-04 MED FILL — LISINOPRIL 5 MG TAB: 5 mg | ORAL | Qty: 2

## 2021-06-04 MED FILL — ATORVASTATIN 40 MG TAB: 40 mg | ORAL | Qty: 1

## 2021-06-04 MED FILL — ELIQUIS 2.5 MG TABLET: 2.5 mg | ORAL | Qty: 1

## 2021-06-04 MED FILL — METOPROLOL SUCCINATE SR 25 MG 24 HR TAB: 25 mg | ORAL | Qty: 4

## 2021-06-04 MED FILL — LANTUS U-100 INSULIN 100 UNIT/ML SUBCUTANEOUS SOLUTION: 100 unit/mL | SUBCUTANEOUS | Qty: 15

## 2021-06-04 NOTE — Progress Notes (Signed)
Physician Progress Note      PATIENT:               Daniel Arellano, Daniel Arellano  CSN #:                  [id number]  DOB:                       [birth year]  ADMIT DATE:       06/01/2021 3:49 PM  DISCH DATE:        06/04/2021 5:13 PM  RESPONDING  PROVIDER #:        Shaquala Broeker MD Gabriel Earing MD          QUERY TEXT:    Pt admitted with A fib and has diastolic  CHF documented. If possible, please document in progress notes and discharge summary further specificity regarding the acuity of CHF:      The medical record reflects the following:  Risk Factors: CHF  Clinical Indicators:  8/18 echo  Left Ventricle: Normal left ventricular systolic function with a visually estimated EF of 60 - 65%. Left ventricle is smaller than normal. Normal wall thickness. Findings consistent with mild concentric hypertrophy. Normal wall motion. Abnormal diastolic function. Normal left ventricular filling pressure.  Left Atrium: Left atrium is mildly dilated.  Trace MR and TR    8/19 PN  Diastolic heart failure      Treatment: Lasix po, cardiology consult    Thank you,  Scarlett Presto RN, CDI, CRCR, CCDS  Certified Clinical Documentation Integrity  Specialist  [phone]  You can also contact me via [employer].  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    Query created by: Maryclare Bean on 06/09/2021 10:48 AM      QUERY TEXT:    Patient admitted with a fib .   Noted documentation of type 2 MI  and  troponin leak  If possible, please document in progress notes and discharge summary if you are evaluating and /or treating any of the following:      The medical record reflects the following:  Risk Factors: elevated  troponins  Clinical Indicators:    8/19 cardiology  Coronary artery disease, LV apical ischemia, troponin leak mild on admission.  Cardiac cath versus medical management discussed with patient, he would like to find out and pursue catheterization    8/18 cardiology note  1.New onset atrial fibrillation with RVR which spontaneously has converted to sinus rhythm with rate control.  2.Mildly elevated troponin which appears to be type II non-STEMI related to problem #1 and poor renal clearance      8/18 PN  ASSESSMENT:  Atrial fibrillation with RVR  Elevated troponin      06/01/21 19:48  Troponin-High Sensitivity: 132 (HH)    06/02/21 05:17  Troponin-High Sensitivity: 166 (HH)      Treatment: troponin monitoring, cardiology consult      Thank you,  Scarlett Presto RN, CDI, CRCR, CCDS  Certified Clinical Documentation Integrity  Specialist  [phone]  You can also contact me via [employer].  Options provided:  -- type 2 MI confirmed and troponin leak ruled out  -- troponin leak  confirmed and type 2 MI ruled out  -- Other - I will add my own diagnosis  -- Disagree - Not applicable / Not valid  -- Disagree - Clinically unable to determine / Unknown  -- Refer to Clinical Documentation Reviewer    PROVIDER RESPONSE TEXT:    After study, type 2 MI confirmed and troponin leak ruled out.    Query created by: Maryclare Bean on 06/09/2021 10:44 AM      Electronically signed by:  Glade Lloyd MD [name] MD 06/10/2021 7:58 AM

## 2021-06-04 NOTE — Progress Notes (Signed)
Progress Note      06/04/2021 12:21 PM  NAME: Daniel Arellano   MRN:  [id number]   Admit Diagnosis: Atrial fibrillation with rapid ventricular response ([name]) [I48.91]          Assessment/Plan:     Paroxysmal atrial fibrillation with RVR.  Patient back in sinus rhythm with rate control.  Continue beta-blocker with anticoagulation by Eliquis.  Non-STEMI with apical ischemia.  Cardiac catheterization yesterday showed no significant occlusive coronary artery disease.  Continue current management with cardiac risk factors modification strategy.   End-stage renal disease with hemodialysis dependency.     Hypertension, stable   Patient okay to be discharged home from cardiac standpoint.         _0        High complexity decision making was performed in this patient at high risk for decompensation with multiple organ involvement.    Subjective:     Daniel Arellano denies chest pain, dyspnea.  Complains of generalized weakness.  Discussed with RN events overnight.     Review of Systems:    Patient complete review of system is negative except for what has been mentioned above    Objective:      Physical Exam:    Last 24hrs VS reviewed since prior progress note. Most recent are:    Visit Vitals  BP 127/62 (BP 1 Location: Right upper arm, BP Patient Position: Semi fowlers;At rest)   Pulse 67   Temp 98.5 ??F (36.9 ??C)   Resp 20   Ht _1  (1.778 m)   Wt 70.3 kg (155 lb)   SpO2 99%   BMI 22.24 kg/m??     No intake or output data in the 24 hours ending 06/04/21 1221     Physical Exam:  Constitutional: Well developed well-nourished patient who appeared in no acute distress  HEENT: Normocephalic and atraumatic.  Extraocular movement intact.  Pupil reactive to light bilaterally  Neck: Supple without thyromegaly  Lungs: Scattered rhonchi diffusely  Heart:  Regular rhythm, normal S1-S2.  Jugular venous distention absent.  Carotid bruits absent.  PMI in fifth intercostal space on left midclavicular line.  Extremities  Trace edema  bilaterally  Abdomen: Soft nontender nondistended hypoactive bowel sounds  Skin: Dry and warm    _2          Post-cath site without hematoma, bruit, tenderness, or thrill.  Distal pulses intact.    PMH/SH reviewed - no change compared to H&P    Data Review    Telemetry: normal sinus rhythm     EKG:   _3   No new EKG for review    XR CHEST PORT   Final Result   Cardiomegaly. Mild right basilar atelectasis. Suspect large hiatal   hernia.              Recent Results (from the past 24 hour(s))   GLUCOSE, POC    Collection Time: 06/03/21  6:49 PM   Result Value Ref Range    Glucose (POC) 129 (H) 65 - 117 mg/dL    Performed by [name]    GLUCOSE, POC    Collection Time: 06/04/21  8:18 AM   Result Value Ref Range    Glucose (POC) 96 65 - 117 mg/dL    Performed by [name]    METABOLIC PANEL, COMPREHENSIVE    Collection Time: 06/04/21 10:01 AM   Result Value Ref Range    Sodium 138 136 - 145 mmol/L    Potassium 3.8 3.5 -  5.1 mmol/L    Chloride 102 97 - 108 mmol/L    CO2 26 21 - 32 mmol/L    Anion gap 10 5 - 15 mmol/L    Glucose 115 (H) 65 - 100 mg/dL    BUN 61 (H) 6 - 20 mg/dL    Creatinine 12.50 (H) 0.70 - 1.30 mg/dL    BUN/Creatinine ratio 5 (L) 12 - 20      GFR est AA 5 (L) >60 ml/min/1.44m    GFR est non-AA 4 (L) >60 ml/min/1.739m   Calcium 9.4 8.5 - 10.1 mg/dL    Bilirubin, total 0.3 0.2 - 1.0 mg/dL    AST (SGOT) 25 15 - 37 U/L    ALT (SGPT) 15 12 - 78 U/L    Alk. phosphatase 69 45 - 117 U/L    Protein, total 5.6 (L) 6.4 - 8.2 g/dL    Albumin 2.9 (L) 3.5 - 5.0 g/dL    Globulin 2.7 2.0 - 4.0 g/dL    A-G Ratio 1.1 1.1 - 2.2     GLUCOSE, POC    Collection Time: 06/04/21 11:27 AM   Result Value Ref Range    Glucose (POC) 99 65 - 117 mg/dL    Performed by TEJoya Gaskins         Echo:   06/01/21    ECHO ADULT COMPLETE 06/02/2021 06/02/2021    Interpretation Summary  Formatting of this result is different from the original.      Left Ventricle: Normal left ventricular systolic function with a visually estimated  EF of 60 - 65%. Left ventricle is smaller than normal. Normal wall thickness. Findings consistent with mild concentric hypertrophy. Normal wall motion. Abnormal diastolic function. Normal left ventricular filling pressure.    Left Atrium: Left atrium is mildly dilated.    Trace MR and TR    Signed by: SaAudrea MuscatMD on 06/02/2021  2:17 PM      Patient's  EKG, laboratory data and echocardiogram were individually reviewed by me.      Lab Data:    Recent Labs     06/03/21  0137 06/01/21  1948   WBC 4.6 4.4   HGB 11.1* 11.2*   HCT 33.5* 34.4*   PLT 138* 166     Recent Labs     06/03/21  0138 06/02/21  1618 06/02/21  1123   APTT 54.9* 52.8* 70.3*      Recent Labs     06/04/21  1001 06/03/21  0137 06/01/21  1604   NA 138 136 135*   K 3.8 3.5 3.1*   CL 102 102 98   CO2 _0 BUN 61* 45* 21*   CREA 12.50* 9.50* 5.45*   GLU 115* 113* 121*   CA 9.4 9.5 10.2*   MG  --   --  2.2     No results for input(s): CPK, CKNDX, TROIQ in the last 72 hours.    No lab exists for component: CPKMB  No results found for: CHOL, CHOLX, CHLST, CHOLV, HDL, HDLP, LDL, LDLC, DLDLP, TGValda FaviaCHHD, CHHDX    Recent Labs     06/04/21  1001 06/03/21  0137 06/01/21  1604   AP 69 66 83   TP 5.6* 6.0* 7.7   ALB 2.9* 3.0* 3.6   GLOB 2.7 3.0 4.1*     No results for input(s): PH, PCO2, PO2 in the last 72 hours.    Medications  Personally Reviewed:    Current Facility-Administered Medications   Medication Dose Route Frequency    sodium chloride (NS) flush 5-40 mL  5-40 mL IntraVENous Q8H    sodium chloride (NS) flush 5-40 mL  5-40 mL IntraVENous PRN    acetaminophen (TYLENOL) tablet 650 mg  650 mg Oral Q4H PRN    naloxone (NARCAN) injection 0.4 mg  0.4 mg IntraVENous PRN    apixaban (ELIQUIS) tablet 2.5 mg  2.5 mg Oral BID    metoprolol (LOPRESSOR) injection 5 mg  5 mg IntraVENous Q5MIN PRN    insulin glargine (LANTUS) injection 15 Units  15 Units SubCUTAneous QHS    atorvastatin (LIPITOR) tablet 40 mg  40 mg Oral DAILY    furosemide (LASIX)  tablet 40 mg  40 mg Oral DAILY    hydrALAZINE (APRESOLINE) tablet 100 mg  100 mg Oral TID    lisinopriL (PRINIVIL, ZESTRIL) tablet 10 mg  10 mg Oral DAILY    metoprolol succinate (TOPROL-XL) XL tablet 100 mg  100 mg Oral DAILY         Prior to Admission medications    Medication Sig Start Date End Date Taking? Authorizing Provider   aspirin delayed-release 81 mg tablet Take 81 mg by mouth daily.   Yes Provider, Historical   insulin detemir U-100 (LEVEMIR U-100 INSULIN) 100 unit/mL injection 15 Units by SubCUTAneous route nightly.  Patient taking differently: 10 Units by SubCUTAneous route nightly. 10/07/18  Yes Muthyala, Elayne Guerin, MD           Audrea Muscat, MD

## 2021-06-04 NOTE — Progress Notes (Deleted)
PROGRESS NOTE    Patient: Daniel Arellano MRN: [id number]  SSN: [id number]    Date of Birth: 16-Jul-1941  Age: 80 y.o.  Sex: male      Admit Date: 06/01/2021    LOS: 3 days       Subjective     CHIEF COMPLAINT:  Atrial fibrillation with RVR     HISTORY OF PRESENT ILLNESS:        Patient is a 80 y.o. year old male, with a past medical history of chronic kidney disease stage 3, cerebral vascular accident, CAD, HTN, hyperlipidemia, and Type 2 diabetes, arrived at the ED with atrial fibrillation in RVR. He was at the dialysis center when the staff noticed the irregular heart rate, which had not occurred before. He has a cardiologist, but does not have any heart conditions that he is aware of. Mr. Klemann attends dialysis on M, W, and Fridays. He denies chest pain, shortness of breath, and nausea and vomiting. He does not smoke and does not drink alcohol regularly.     Troponin 166  K 3.1  Creatinine 5.45  GFR 10     Chest XR  Impression: Cardiomegaly. Mild right basilar atelectasis. Suspect large hiatal hernia.     8/18  Patient awake alert resting in bed in NAD    8/19  Patient awake alert resting in bed in NAD    PTT 54.9  Creatinine 9.5  GFR 6, continues to decline    ECHO 8/18    Left Ventricle: Normal left ventricular systolic function with a visually estimated EF of 60 - 65%. Left ventricle is smaller than normal. Normal wall thickness. Findings consistent with mild concentric hypertrophy. Normal wall motion. Abnormal diastolic function. Normal left ventricular filling pressure.    Left Atrium: Left atrium is mildly dilated.    Trace MR and TR    Nuclear Cardiac Stress Test 8/18    Nuclear Findings: LVEF measures 50%. LV perfusion is probably abnormal. There is evidence of inducible ischemia.    Nuclear Findings: LVEF measures 50%. There is a mild severity left ventricular stress perfusion defect present in the apex segment(s) that is reversible. There is normal wall motion in the defect area. The defect appears to be  probable ischemia.    Nuclear Findings: Normal left ventricular systolic function post-stress. LVEF measures 50%.    Nuclear Findings: The study is most consistent with mild myocardial ischemia. LVEF measures 50%.    EKG 8/17  Atrial fibrillation   Left axis deviation   Nonspecific ST abnormality   Abnormal ECG   No previous ECGs available    8/20  Patient laying in bed comfortably. NAD.  Patient expressing desire to be discharged.    Review of Systems:  Constitutional: Negative for chills and fever.   HENT: Negative.    Eyes: Negative.    Respiratory: Negative.    Cardiovascular: Negative.    Gastrointestinal: Negative for abdominal pain and nausea.   Skin: Negative.    Neurological: Negative.        Objective     Visit Vitals  BP 129/66 (BP 1 Location: Right upper arm)   Pulse 70   Temp 98.2 F (36.8 C)   Resp 20   Ht 5\' 10"  (1.778 m)   Wt 70.3 kg (155 lb)   SpO2 98%   BMI 22.24 kg/m      O2 Device: None (Room air)    Physical Exam:   Constitutional: pt is oriented to person, place,  and time.   HENT:   Head: Normocephalic and atraumatic.   Eyes: Pupils are equal, round, and reactive to light. EOM are normal.   Cardiovascular: Normal rate, regular rhythm and normal heart sounds.   Pulmonary/Chest: Breath sounds normal. No wheezes. No rales.   Exhibits no tenderness.   Abdominal: Soft. Bowel sounds are normal. There is no abdominal tenderness. There is no rebound and no guarding.   Musculoskeletal: Normal range of motion.   Neurological: pt is alert and oriented to person, place, and time. Alert. Normal strength. No cranial nerve deficit or sensory deficit.    Intake & Output:  Current Shift: No intake/output data recorded.  Last three shifts: No intake/output data recorded.    Lab/Data Review:  All lab results for the last 24 hours reviewed.       24 Hour Results:    Recent Results (from the past 24 hour(s))   EKG, 12 LEAD, SUBSEQUENT    Collection Time: 06/03/21  9:20 AM   Result Value Ref Range    Ventricular  Rate 79 BPM    Atrial Rate 79 BPM    P-R Interval 176 ms    QRS Duration 84 ms    Q-T Interval 410 ms    QTC Calculation (Bezet) 470 ms    Calculated P Axis 37 degrees    Calculated R Axis -14 degrees    Calculated T Axis 37 degrees    Diagnosis       Sinus rhythm with occasional Premature ventricular complexes  Otherwise normal ECG  Confirmed by JOSEPH MD, MATHEW ([id number]) on 06/03/2021 12:32:02 PM     GLUCOSE, POC    Collection Time: 06/03/21 10:37 AM   Result Value Ref Range    Glucose (POC) 84 65 - 117 mg/dL    Performed by Gretta Arab    GLUCOSE, POC    Collection Time: 06/03/21  6:49 PM   Result Value Ref Range    Glucose (POC) 129 (H) 65 - 117 mg/dL    Performed by Kathlynn Grate    GLUCOSE, POC    Collection Time: 06/04/21  8:18 AM   Result Value Ref Range    Glucose (POC) 96 65 - 117 mg/dL    Performed by Consuello Closs          Imaging:    XR CHEST PORT   Final Result   Cardiomegaly. Mild right basilar atelectasis. Suspect large hiatal   hernia.                Assessment     Diastolic heart falure  Atrial fibrillation with RVR  Elevated troponin  End-stage renal disease on hemodialysis  CVA  CAD  HTN  Hyperlipidemia  Hypokalemia    Low systemic pressures due to intra-arterial vasodilators.  Patent left main, severely calcific LAD with mid to distal 50 to 60% disease and very distal LAD acid wraps around the left ventricular apex with up to 70% disease.  Nondominant and circumflex with nonobstructive disease.  Dominant right coronary artery with mid PDA branch with up to 50% disease.     Continue medical management and risk modification      Plan   Eliquis 2.5mg  PO BID  Aspirin 81 mg POD  Lipitor 40 mg POD  Plavix 75 mg POD  Lasix 40 mg POD  Apresoline 100 mg 3 times daily  Prinivil 10 mg POD  Toprol-XL 100 mg POD  Lantus injection 15 units bedtime  Plan for discharge today    Current Facility-Administered Medications:     sodium chloride (NS) flush 5-40 mL, 5-40 mL, IntraVENous, Q8H, Johnnette Litter, MD,  10 mL at 06/04/21 0504    sodium chloride (NS) flush 5-40 mL, 5-40 mL, IntraVENous, PRN, Johnnette Litter, MD    acetaminophen (TYLENOL) tablet 650 mg, 650 mg, Oral, Q4H PRN, Johnnette Litter, MD    naloxone [facility]) injection 0.4 mg, 0.4 mg, IntraVENous, PRN, Johnnette Litter, MD    ondansetron [facility]) injection 4 mg, 4 mg, IntraVENous, ONCE PRN, Johnnette Litter, MD    apixaban [facility]) tablet 2.5 mg, 2.5 mg, Oral, BID, Johnnette Litter, MD, 2.5 mg at 06/03/21 2211    metoprolol (LOPRESSOR) injection 5 mg, 5 mg, IntraVENous, Q5MIN PRN, Mohiuddin, Abdul, MD, 5 mg at 06/01/21 1609    insulin glargine (LANTUS) injection 15 Units, 15 Units, SubCUTAneous, QHS, Mohiuddin, Abdul, MD, 15 Units at 06/03/21 2211    atorvastatin (LIPITOR) tablet 40 mg, 40 mg, Oral, DAILY, Mohiuddin, Abdul, MD, 40 mg at 06/03/21 1457    furosemide (LASIX) tablet 40 mg, 40 mg, Oral, DAILY, Mohiuddin, Abdul, MD, 40 mg at 06/02/21 1114    hydrALAZINE (APRESOLINE) tablet 100 mg, 100 mg, Oral, TID, Mohiuddin, Abdul, MD, 100 mg at 06/03/21 2211    lisinopriL (PRINIVIL, ZESTRIL) tablet 10 mg, 10 mg, Oral, DAILY, Mohiuddin, Abdul, MD    metoprolol succinate (TOPROL-XL) XL tablet 100 mg, 100 mg, Oral, DAILY, Mohiuddin, Abdul, MD, 100 mg at 06/03/21 1456    Current Outpatient Medications   Medication Instructions    aspirin delayed-release 81 mg, Oral, DAILY    insulin detemir U-100 (LEVEMIR U-100 INSULIN) 15 Units, SubCUTAneous, EVERY BEDTIME         Signed By: Eartha Inch     June 04, 2021

## 2021-06-04 NOTE — Discharge Summary (Signed)
Discharge Summary       PATIENT ID: Daniel Arellano  MRN: [id number]   DATE OF BIRTH: 08/17/1941    DATE OF ADMISSION: 06/01/2021  3:49 PM    DATE OF DISCHARGE:   PRIMARY CARE PROVIDER: Ardean Larsen, NP     ATTENDING PHYSICIAN: Marcy Siren Elvi Leventhal  DISCHARGING PROVIDER: Marcy Siren Dossie Swor      CONSULTATIONS: IP CONSULT TO CARDIOLOGY    PROCEDURES/SURGERIES: Procedure(s):  LEFT HEART CATH / CORONARY ANGIOGRAPHY    ADMITTING DIAGNOSES:    Patient Active Problem List    Diagnosis Date Noted    Atrial fibrillation with rapid ventricular response ([place]) 06/01/2021    Type 2 diabetes mellitus with hyperglycemia, with long-term current use of insulin ([place]) 08/16/2018       DISCHARGE DIAGNOSES / PLAN:      Diastolic heart falure  Atrial fibrillation with RVR  Elevated troponin  End-stage renal disease on hemodialysis  CVA  CAD  HTN  Hyperlipidemia  Hypokalemia        DISCHARGE MEDICATIONS:  Current Discharge Medication List        START taking these medications    Details   apixaban (ELIQUIS) 2.5 mg tablet Take 1 Tablet by mouth two (2) times a day.  Qty: 60 Tablet, Refills: 0  Start date: 06/04/2021           CONTINUE these medications which have CHANGED    Details   atorvastatin (LIPITOR) 40 mg tablet Take 1 Tablet by mouth daily.  Qty: 30 Tablet, Refills: 0  Start date: 06/04/2021      furosemide (LASIX) 40 mg tablet Take 1 Tablet by mouth daily.  Qty: 30 Tablet, Refills: 0  Start date: 06/04/2021      lisinopriL (PRINIVIL, ZESTRIL) 10 mg tablet Take 1 Tablet by mouth daily.  Qty: 30 Tablet, Refills: 0  Start date: 06/05/2021      metoprolol succinate (TOPROL-XL) 100 mg tablet Take 1 Tablet by mouth daily.  Qty: 30 Tablet, Refills: 0  Start date: 06/04/2021           CONTINUE these medications which have NOT CHANGED    Details   aspirin delayed-release 81 mg tablet Take 81 mg by mouth daily.      insulin detemir U-100 (LEVEMIR U-100 INSULIN) 100 unit/mL injection 15 Units by SubCUTAneous route nightly.  Qty: 15 mL, Refills: 4            STOP taking these medications       hydrALAZINE (APRESOLINE) 100 mg tablet Comments:   Reason for Stopping:                 NOTIFY YOUR PHYSICIAN FOR ANY OF THE FOLLOWING:   Fever over 101 degrees for 24 hours.   Chest pain, shortness of breath, fever, chills, nausea, vomiting, diarrhea, change in mentation, falling, weakness, bleeding. Severe pain or pain not relieved by medications.  Or, any other signs or symptoms that you may have questions about.    DISPOSITION:  x  Home With:   OT  PT  HH  RN       Long term SNF/Inpatient Rehab    Independent/assisted living    Hospice    Other:       PATIENT CONDITION AT DISCHARGE: Stable      PHYSICAL EXAMINATION AT DISCHARGE:  General:          Alert, cooperative, no distress, appears stated age.     HEENT:  Atraumatic, anicteric sclerae, pink conjunctivae                          No oral ulcers, mucosa moist, throat clear, dentition fair  Neck:               Supple, symmetrical  Lungs:             Clear to auscultation bilaterally.  No Wheezing or Rhonchi. No rales.  Chest wall:      No tenderness  No Accessory muscle use.  Heart:              Regular  rhythm,  No  murmur   No edema  Abdomen:        Soft, non-tender. Not distended.  Bowel sounds normal  Extremities:     No cyanosis.  No clubbing,                            Skin turgor normal, Capillary refill normal  Skin:                Not pale.  Not Jaundiced  No rashes   Psych:             Not anxious or agitated.  Neurologic:      Alert, moves all extremities, answers questions appropriately and responds to commands     XR CHEST PORT   Final Result   Cardiomegaly. Mild right basilar atelectasis. Suspect large hiatal   hernia.              Recent Results (from the past 24 hour(s))   GLUCOSE, POC    Collection Time: 06/03/21  6:49 PM   Result Value Ref Range    Glucose (POC) 129 (H) 65 - 117 mg/dL    Performed by [name]    GLUCOSE, POC    Collection Time: 06/04/21  8:18 AM   Result Value Ref Range     Glucose (POC) 96 65 - 117 mg/dL    Performed by [name]    METABOLIC PANEL, COMPREHENSIVE    Collection Time: 06/04/21 10:01 AM   Result Value Ref Range    Sodium 138 136 - 145 mmol/L    Potassium 3.8 3.5 - 5.1 mmol/L    Chloride 102 97 - 108 mmol/L    CO2 26 21 - 32 mmol/L    Anion gap 10 5 - 15 mmol/L    Glucose 115 (H) 65 - 100 mg/dL    BUN 61 (H) 6 - 20 mg/dL    Creatinine 12.50 (H) 0.70 - 1.30 mg/dL    BUN/Creatinine ratio 5 (L) 12 - 20      GFR est AA 5 (L) >60 ml/min/1.77m    GFR est non-AA 4 (L) >60 ml/min/1.730m   Calcium 9.4 8.5 - 10.1 mg/dL    Bilirubin, total 0.3 0.2 - 1.0 mg/dL    AST (SGOT) 25 15 - 37 U/L    ALT (SGPT) 15 12 - 78 U/L    Alk. phosphatase 69 45 - 117 U/L    Protein, total 5.6 (L) 6.4 - 8.2 g/dL    Albumin 2.9 (L) 3.5 - 5.0 g/dL    Globulin 2.7 2.0 - 4.0 g/dL    A-G Ratio 1.1 1.1 - 2.2     GLUCOSE, POC    Collection Time: 06/04/21 11:27 AM   Result Value Ref  Range    Glucose (POC) 99 65 - 117 mg/dL    Performed by [facility] COURSE:    Patient is a 80 y.o. year old male, with a past medical history of chronic kidney disease stage 3, cerebral vascular accident, CAD, HTN, hyperlipidemia, and Type 2 diabetes, arrived at the ED with atrial fibrillation in RVR. He was at the dialysis center when the staff noticed the irregular heart rate, which had not occurred before. He has a cardiologist, but does not have any heart conditions that he is aware of. Mr. Yeager attends dialysis on M, W, and Fridays. He denies chest pain, shortness of breath, and nausea and vomiting. He does not smoke and does not drink alcohol regularly.     Troponin 166  K 3.1  Creatinine 5.45  GFR 10     Chest XR  Impression: Cardiomegaly. Mild right basilar atelectasis. Suspect large hiatal hernia.     8/18  Patient awake alert resting in bed in NAD     8/19  Patient awake alert resting in bed in NAD     PTT 54.9  Creatinine 9.5  GFR 6, continues to decline     ECHO 8/18    Left Ventricle:  Normal left ventricular systolic function with a visually estimated EF of 60 - 65%. Left ventricle is smaller than normal. Normal wall thickness. Findings consistent with mild concentric hypertrophy. Normal wall motion. Abnormal diastolic function. Normal left ventricular filling pressure.    Left Atrium: Left atrium is mildly dilated.    Trace MR and TR     Nuclear Cardiac Stress Test 8/18    Nuclear Findings: LVEF measures 50%. LV perfusion is probably abnormal. There is evidence of inducible ischemia.    Nuclear Findings: LVEF measures 50%. There is a mild severity left ventricular stress perfusion defect present in the apex segment(s) that is reversible. There is normal wall motion in the defect area. The defect appears to be probable ischemia.    Nuclear Findings: Normal left ventricular systolic function post-stress. LVEF measures 50%.    Nuclear Findings: The study is most consistent with mild myocardial ischemia. LVEF measures 50%.     EKG 8/17  Atrial fibrillation   Left axis deviation   Nonspecific ST abnormality   Abnormal ECG   No previous ECGs available     8/20  Patient laying in bed comfortably. NAD.  Patient expressing desire to be discharged    Was seen by the cardiology patient with cardiac cath yesterday shows no significant occlusive coronary artery disease    Denies any chest pain shortness of breath nausea no vomiting discharged home follow-up with cardiology in 1 to 2 weeks medication reconciliation done      Signed:   Glynn Octave, MD  06/04/2021  1:07 PM

## 2023-01-29 ENCOUNTER — Inpatient Hospital Stay
Admission: EM | Admit: 2023-01-29 | Discharge: 2023-01-31 | Disposition: A | Discharge status: Home / Self Care | Payer: MEDICARE | Source: Other Acute Inpatient Hospital | Admitting: Internal Medicine

## 2023-01-29 ENCOUNTER — Emergency Department: Admit: 2023-01-29 | Payer: MEDICARE | Primary: Nephrology

## 2023-01-29 DIAGNOSIS — R4701 Aphasia: Secondary | ICD-10-CM

## 2023-01-29 DIAGNOSIS — R4182 Altered mental status, unspecified: Secondary | ICD-10-CM

## 2023-01-29 LAB — CBC
Hematocrit: 35 % — ABNORMAL LOW (ref 36.6–50.3)
Hemoglobin: 11.8 g/dL — ABNORMAL LOW (ref 12.1–17.0)
MCH: 29.4 PG (ref 26.0–34.0)
MCHC: 33.7 g/dL (ref 30.0–36.5)
MCV: 87.1 FL (ref 80.0–99.0)
MPV: 11.3 FL (ref 8.9–12.9)
Nucleated RBCs: 0 PER 100 WBC
Platelets: 151 10*3/uL (ref 150–400)
RBC: 4.02 M/uL — ABNORMAL LOW (ref 4.10–5.70)
RDW: 14.5 % (ref 11.5–14.5)
WBC: 5.2 10*3/uL (ref 4.1–11.1)
nRBC: 0 10*3/uL (ref 0.00–0.01)

## 2023-01-29 LAB — COMPREHENSIVE METABOLIC PANEL
ALT: 13 U/L (ref 12–78)
AST: 28 U/L (ref 15–37)
Albumin/Globulin Ratio: 1 — ABNORMAL LOW (ref 1.1–2.2)
Albumin: 3.7 g/dL (ref 3.5–5.0)
Alk Phosphatase: 68 U/L (ref 45–117)
Anion Gap: 15 mmol/L (ref 5–15)
BUN/Creatinine Ratio: 4 — ABNORMAL LOW (ref 12–20)
BUN: 59 mg/dL — ABNORMAL HIGH (ref 6–20)
CO2: 22 mmol/L (ref 21–32)
Calcium: 10.3 mg/dL — ABNORMAL HIGH (ref 8.5–10.1)
Chloride: 99 mmol/L (ref 97–108)
Creatinine: 13.9 mg/dL — ABNORMAL HIGH (ref 0.70–1.30)
Est, Glom Filt Rate: 3 mL/min/{1.73_m2} — ABNORMAL LOW (ref >60)
Globulin: 3.7 g/dL (ref 2.0–4.0)
Glucose: 144 mg/dL — ABNORMAL HIGH (ref 65–100)
Potassium: 4.4 mmol/L (ref 3.5–5.1)
Sodium: 136 mmol/L (ref 136–145)
Total Bilirubin: 0.6 mg/dL (ref 0.2–1.0)
Total Protein: 7.4 g/dL (ref 6.4–8.2)

## 2023-01-29 LAB — ETHANOL: Ethanol Lvl: <10 mg/dL (ref <10)

## 2023-01-29 MED ORDER — POLYETHYLENE GLYCOL 3350 17 G PO PACK
17 | Freq: Every day | ORAL | Status: DC | PRN
Start: 2023-01-29 — End: 2023-01-31
  Administered 2023-01-31: 15:00:00 17 g via ORAL

## 2023-01-29 MED ORDER — PROPYLTHIOURACIL 50 MG PO TABS
50 MG | Freq: Three times a day (TID) | ORAL | Status: DC
Start: 2023-01-29 — End: 2023-01-31
  Administered 2023-01-30 – 2023-01-31 (×5): 50 mg via ORAL

## 2023-01-29 MED ORDER — SODIUM CHLORIDE 0.9 % IV SOLN
0.9 | INTRAVENOUS | Status: DC | PRN
Start: 2023-01-29 — End: 2023-01-31

## 2023-01-29 MED ORDER — NORMAL SALINE FLUSH 0.9 % IV SOLN
0.9 % | Freq: Two times a day (BID) | INTRAVENOUS | Status: DC
Start: 2023-01-29 — End: 2023-01-31
  Administered 2023-01-30 – 2023-01-31 (×3): 10 mL via INTRAVENOUS

## 2023-01-29 MED ORDER — ATORVASTATIN CALCIUM 40 MG PO TABS
40 | Freq: Every day | ORAL | Status: DC
Start: 2023-01-29 — End: 2023-01-29

## 2023-01-29 MED ORDER — DEXTROSE 10 % IV SOLN
10 % | INTRAVENOUS | Status: DC | PRN
Start: 2023-01-29 — End: 2023-01-31

## 2023-01-29 MED ORDER — NORMAL SALINE FLUSH 0.9 % IV SOLN
0.9 % | INTRAVENOUS | Status: DC | PRN
Start: 2023-01-29 — End: 2023-01-31

## 2023-01-29 MED ORDER — DEXTROSE 10 % IV BOLUS
INTRAVENOUS | Status: DC | PRN
Start: 2023-01-29 — End: 2023-01-31

## 2023-01-29 MED ORDER — FUROSEMIDE 40 MG PO TABS
40 MG | Freq: Every day | ORAL | Status: DC
Start: 2023-01-29 — End: 2023-01-31
  Administered 2023-01-30 – 2023-01-31 (×2): 40 mg via ORAL

## 2023-01-29 MED ORDER — GLUCAGON HCL (DIAGNOSTIC) 1 MG IJ SOLR
1 MG | INTRAMUSCULAR | Status: DC | PRN
Start: 2023-01-29 — End: 2023-01-31

## 2023-01-29 MED ORDER — LISINOPRIL 10 MG PO TABS
10 | Freq: Every day | ORAL | Status: DC
Start: 2023-01-29 — End: 2023-01-31

## 2023-01-29 MED ORDER — GLUCOSE 4 G PO CHEW
4 | ORAL | Status: DC | PRN
Start: 2023-01-29 — End: 2023-01-31

## 2023-01-29 MED ORDER — ONDANSETRON HCL 4 MG/2ML IJ SOLN
4 | Freq: Four times a day (QID) | INTRAMUSCULAR | Status: DC | PRN
Start: 2023-01-29 — End: 2023-01-31

## 2023-01-29 MED ORDER — ASPIRIN 81 MG PO TBEC
81 MG | Freq: Every day | ORAL | Status: DC
Start: 2023-01-29 — End: 2023-01-31
  Administered 2023-01-30 – 2023-01-31 (×2): 81 mg via ORAL

## 2023-01-29 MED ORDER — ONDANSETRON 4 MG PO TBDP
4 MG | Freq: Three times a day (TID) | ORAL | Status: DC | PRN
Start: 2023-01-29 — End: 2023-01-31

## 2023-01-29 MED ORDER — INSULIN GLARGINE 100 UNIT/ML SC SOLN
100 UNIT/ML | Freq: Every evening | SUBCUTANEOUS | Status: DC
Start: 2023-01-29 — End: 2023-01-31
  Administered 2023-01-30 – 2023-01-31 (×2): 10 [IU] via SUBCUTANEOUS

## 2023-01-29 MED ORDER — APIXABAN 2.5 MG PO TABS
2.5 | Freq: Two times a day (BID) | ORAL | Status: DC
Start: 2023-01-29 — End: 2023-01-31
  Administered 2023-01-30 – 2023-01-31 (×4): 2.5 mg via ORAL

## 2023-01-29 MED ORDER — ATORVASTATIN CALCIUM 40 MG PO TABS
40 MG | Freq: Every evening | ORAL | Status: DC
Start: 2023-01-29 — End: 2023-01-31
  Administered 2023-01-30 – 2023-01-31 (×2): 80 mg via ORAL

## 2023-01-29 MED FILL — SODIUM CHLORIDE FLUSH 0.9 % IV SOLN: 0.9 % | INTRAVENOUS | Qty: 40

## 2023-01-29 MED FILL — DEXTROSE 10 % IV SOLN: 10 % | INTRAVENOUS | Qty: 1000

## 2023-01-29 MED FILL — PROPYLTHIOURACIL 50 MG PO TABS: 50 MG | ORAL | Qty: 1

## 2023-01-29 NOTE — ED Notes (Signed)
Pt placed on continuous cardiac monitoring, pulse ox, and blood pressure monitoring at this time.

## 2023-01-29 NOTE — ED Provider Notes (Signed)
[facility] EMERGENCY DEPT  EMERGENCY DEPARTMENT HISTORY AND PHYSICAL EXAM      Date: 01/29/2023  Patient Name: Daniel Arellano  MRN: [id number]  Birthdate Mar 11, 1941  Date of evaluation: 01/29/2023  Provider: Vladimir Crofts, MD   Note Started: 11:48 AM EDT 01/29/23    HISTORY OF PRESENT ILLNESS     Chief Complaint   Patient presents with    Altered Mental Status       History Provided By: Patient    HPI: Daniel Arellano is a 82 y.o. male with PMH of HLD, HTN, DM, CVA, and CKD who comes to the ED for evaluation of confusion.  Patient report that he woke up yesterday feeling more confused than normal and has some weakness in his leg.   Patient report that he woke up yesterday feeling more confused than normal and has some weakness in his leg.  Reportedly, patient is able to ambulate without assistance and has been using cane since yesterday.  He was seen today at the dialysis clinic and then sent to the ED for further evaluation given that he was confused.  Last time he was seen at the dialysis center was on Friday and he was his normal self.  Patient denies any trauma.  There is no focal weakness.  Denies any chest pain or shortness of breath.    PAST MEDICAL HISTORY   Past Medical History:  Past Medical History:   Diagnosis Date    Chronic kidney disease, stage 3 (HCC)     Coronary artery disease involving native coronary artery of native heart without angina pectoris     CVA (cerebral vascular accident) (HCC)     Essential hypertension     Hyperlipidemia     Sequela of cerebrovascular accident     Type 2 diabetes mellitus (HCC)        Past Surgical History:  History reviewed. No pertinent surgical history.    Family History:  Family History   Problem Relation Age of Onset    Hypertension Father     Diabetes Father     Hypertension Mother     Diabetes Mother        Social History:  Social History     Tobacco Use    Smoking status: Never    Smokeless tobacco: Never   Substance Use Topics    Alcohol use: Not Currently    Drug use:  Never       Allergies:  No Known Allergies    PCP: Bankuru, Donne Anon, MD    Current Meds:   Current Facility-Administered Medications   Medication Dose Route Frequency Provider Last Rate Last Admin    apixaban (ELIQUIS) tablet 2.5 mg  2.5 mg Oral BID Delma Officer I, MD        aspirin EC tablet 81 mg  81 mg Oral Daily Agada, Raphael I, MD        furosemide (LASIX) tablet 40 mg  40 mg Oral Daily Agada, Raphael I, MD        insulin glargine (LANTUS) injection vial 10 Units  10 Units SubCUTAneous Nightly Agada, Raphael I, MD        lisinopril (PRINIVIL;ZESTRIL) tablet 10 mg  10 mg Oral Daily Agada, Raphael I, MD        sodium chloride flush 0.9 % injection 5-40 mL  5-40 mL IntraVENous 2 times per day Delma Officer I, MD        sodium chloride flush 0.9 % injection 5-40  mL  5-40 mL IntraVENous PRN Delma Officer I, MD        0.9 % sodium chloride infusion   IntraVENous PRN Delma Officer I, MD        ondansetron (ZOFRAN-ODT) disintegrating tablet 4 mg  4 mg Oral Q8H PRN Delma Officer I, MD        Or    ondansetron (ZOFRAN) injection 4 mg  4 mg IntraVENous Q6H PRN Delma Officer I, MD        polyethylene glycol (GLYCOLAX) packet 17 g  17 g Oral Daily PRN Delma Officer I, MD        atorvastatin (LIPITOR) tablet 80 mg  80 mg Oral Nightly Delma Officer I, MD        glucose chewable tablet 16 g  4 tablet Oral PRN Delma Officer I, MD        dextrose bolus 10% 125 mL  125 mL IntraVENous PRN Delma Officer I, MD        Or    dextrose bolus 10% 250 mL  250 mL IntraVENous PRN Delma Officer I, MD        glucagon injection 1 mg  1 mg SubCUTAneous PRN Delma Officer I, MD        dextrose 10 % infusion   IntraVENous Continuous PRN Delma Officer I, MD         Current Outpatient Medications   Medication Sig Dispense Refill    apixaban (ELIQUIS) 2.5 MG TABS tablet Take 1 tablet by mouth 2 times daily      aspirin 81 MG EC tablet Take 1 tablet by mouth daily      atorvastatin (LIPITOR) 40 MG tablet Take 1 tablet by mouth daily       furosemide (LASIX) 40 MG tablet Take 1 tablet by mouth daily      insulin detemir (LEVEMIR) 100 UNIT/ML injection vial Inject 15 Units into the skin      lisinopril (PRINIVIL;ZESTRIL) 10 MG tablet Take 1 tablet by mouth daily      metoprolol succinate (TOPROL XL) 100 MG extended release tablet Take 1 tablet by mouth daily         Social Determinants of Health:   Social Determinants of Health     Tobacco Use: Low Risk  (01/29/2023)    Patient History     Smoking Tobacco Use: Never     Smokeless Tobacco Use: Never     Passive Exposure: Not on file   Alcohol Use: Not At Risk (01/29/2023)    AUDIT-C     Frequency of Alcohol Consumption: Never     Average Number of Drinks: Patient does not drink     Frequency of Binge Drinking: Never   Financial Resource Strain: Not on file   Food Insecurity: Not on file   Transportation Needs: Not on file   Physical Activity: Not on file   Stress: Not on file   Social Connections: Not on file   Intimate Partner Violence: Not on file   Depression: Not on file   Housing Stability: Not on file   Interpersonal Safety: Not At Risk (01/29/2023)    Interpersonal Safety Domain Source: IP Abuse Screening     Physical abuse: Denies     Verbal abuse: Denies     Emotional abuse: Denies     Financial abuse: Denies     Sexual abuse: Denies   Utilities: Not on file       PHYSICAL EXAM   Physical  Exam  Vitals and nursing note reviewed.   Constitutional:       General: He is not in acute distress.     Appearance: He is not ill-appearing, toxic-appearing or diaphoretic.   HENT:      Head: Normocephalic and atraumatic.   Cardiovascular:      Rate and Rhythm: Normal rate and regular rhythm.   Pulmonary:      Effort: Pulmonary effort is normal.      Breath sounds: Normal breath sounds.   Musculoskeletal:      Cervical back: Normal range of motion and neck supple.   Neurological:      Comments: Alert, mildly confused, no dysarthria, mild aphasia, motor 5 out of 5 in all extremities, sensation is intact in  all extremities         SCREENINGS     NIH Stroke Scale  Interval: Baseline  Level of Consciousness (1a): Alert  LOC Questions (1b): Answers both correctly  LOC Commands (1c): Performs both tasks correctly  Best Gaze (2): Normal  Visual (3): No visual loss  Facial Palsy (4): Normal symmetrical movement  Motor Arm, Left (5a): No drift  Motor Arm, Right (5b): No drift  Motor Leg, Left (6a): No drift  Motor Leg, Right (6b): No drift  Limb Ataxia (7): Absent  Sensory (8): Normal  Best Language (9): Mild to moderate aphasia  Dysarthria (10): Normal  Extinction and Inattention (11): No abnormality  Total: 1             LAB, EKG AND DIAGNOSTIC RESULTS   Labs:  Recent Results (from the past 12 hour(s))   CBC    Collection Time: 01/29/23 11:23 AM   Result Value Ref Range    WBC 5.2 4.1 - 11.1 K/uL    RBC 4.02 (L) 4.10 - 5.70 M/uL    Hemoglobin 11.8 (L) 12.1 - 17.0 g/dL    Hematocrit 16.1 (L) 36.6 - 50.3 %    MCV 87.1 80.0 - 99.0 FL    MCH 29.4 26.0 - 34.0 PG    MCHC 33.7 30.0 - 36.5 g/dL    RDW 09.6 04.5 - 40.9 %    Platelets 151 150 - 400 K/uL    MPV 11.3 8.9 - 12.9 FL    Nucleated RBCs 0.0 0.0 PER 100 WBC    nRBC 0.00 0.00 - 0.01 K/uL   Comprehensive Metabolic Panel    Collection Time: 01/29/23 11:23 AM   Result Value Ref Range    Sodium 136 136 - 145 mmol/L    Potassium 4.4 3.5 - 5.1 mmol/L    Chloride 99 97 - 108 mmol/L    CO2 22 21 - 32 mmol/L    Anion Gap 15 5 - 15 mmol/L    Glucose 144 (H) 65 - 100 mg/dL    BUN 59 (H) 6 - 20 mg/dL    Creatinine 81.19 (H) 0.70 - 1.30 mg/dL    Bun/Cre Ratio 4 (L) 12 - 20      Est, Glom Filt Rate 3 (L) >60 ml/min/1.58m2    Calcium 10.3 (H) 8.5 - 10.1 mg/dL    Total Bilirubin 0.6 0.2 - 1.0 mg/dL    AST 28 15 - 37 U/L    ALT 13 12 - 78 U/L    Alk Phosphatase 68 45 - 117 U/L    Total Protein 7.4 6.4 - 8.2 g/dL    Albumin 3.7 3.5 - 5.0 g/dL    Globulin 3.7 2.0 -  4.0 g/dL    Albumin/Globulin Ratio 1.0 (L) 1.1 - 2.2     Ethanol    Collection Time: 01/29/23 11:23 AM   Result Value Ref Range     Ethanol Lvl <10 <10 mg/dL       EKG:.See ED Course Below    Radiologic Studies:  Non-plain film images such as CT, Ultrasound and MRI are read by the radiologist. Plain radiographic images are visualized and preliminarily interpreted by the ED Provider with the following findings: See ED Course Below    Interpretation per the Radiologist below, if available at the time of this note:  CT HEAD WO CONTRAST   Final Result      No evidence for acute intracranial abnormality               MRI brain without contrast    (Results Pending)        ED COURSE and DIFFERENTIAL DIAGNOSIS/MDM   4:53 PM Differential and Considerations: Daniel Arellano is a 82 year old male with Daniel Arellano history of DM, HTN, HLD, CKD on HD and CVA without unknown deficits who comes to the ED with confusion, mild aphasia, and weakness.  Patient presentation is concerning for stroke.  Patient's symptoms have been more than 24 hours.  He however does have significant deficits.  Could be also secondary to uremia given history of CKD, electrolyte derangement.  It is less likely be secondary to UTI as the patient does not have any symptom suggestive of such.  If the patient is producing urine then we will send a urinalysis sample.  Will also get CT of that ensure that not have any intracranial bleed.    Records Reviewed (source and summary of external notes): Prior medical records and Nursing notes.    Vitals:    Vitals:    01/29/23 1300 01/29/23 1400 01/29/23 1500 01/29/23 1600   BP: (!) 145/86 (!) 160/79 (!) 145/54 126/72   Pulse: 86 88 81 78   Resp: 18 18 23 15    Temp:       TempSrc:       SpO2: 97% 98% 98% 99%   Weight:       Height:         ED Course as of 01/29/23 1653   Mon Jan 29, 2023   1151 I independently reviewed interpreted patient EKG which showed sinus tachycardia at a rate of 102 with first-degree AV block, QRS and QTc within normal, left axis deviation, no ST elevation meeting criteria, no signs of dysrhythmia no additional signs of blocks.   Occasional PVCs. [AA]      ED Course User Index  [AA] Al Hazmi, Meridee Branum I, MD     I independently reviewed and interpreted the patient workup.  CBC did not show any leukocytosis or significant anemia or abnormal platelet count.  Chemistry without electrolyte derangement.  Elevated creatinine.  Consistent with history of CKD.  Alcohol level is unremarkable.  CT of the head with read myself is negative for acute finding.  Given that the patient's symptoms been going on for more than 2 days, patient will be admitted to the hospital for ultimate status and possibly stroke workup.  Discussed with the accepting physician who will come about patient for admission.    ED IMPRESSION     1. Altered mental status, unspecified altered mental status type    2. Chronic kidney disease, unspecified CKD stage          DISPOSITION/PLAN   DISPOSITION  Admitted 01/29/2023 04:23:23 PM    Admit Note: Pt is being admitted by Dr. Court Joy. The results of their tests and reason(s) for their admission have been discussed with pt and/or available family. They convey agreement and understanding for the need to be admitted and for the admission diagnosis.       I am the Primary Clinician of Record. Ardythe Klute Nils Pyle, MD (electronically signed)    (Please note that parts of this dictation were completed with voice recognition software. Quite often unanticipated grammatical, syntax, homophones, and other interpretive errors are inadvertently transcribed by the computer software. Please disregards these errors. Please excuse any errors that have escaped final proofreading.)     Al Hazmi, Louan Base I, MD  01/29/23 [phone]

## 2023-01-29 NOTE — ED Triage Notes (Addendum)
From US renal with c/o AMS. Unknown LOC due to family not knowing, but [facility] stated patient was at baseline on Friday during his treatment.   Had difficulty walking into dialysis center this morning, was using cane. Does not use cane at baseline.   Pt reports not being able to get words out when asked questions.   BG 120    Hx HD MWF

## 2023-01-29 NOTE — ED Notes (Signed)
ED TO INPATIENT SBAR HANDOFF    Patient Name: Daniel Arellano   Preferred Name: [name]  DOB: Aug 01, 1941  82 y.o.   Family/Caregiver Present: no   Code Status Order: Full Code  PO Status: regular minced  Telemetry Order:   C-SSRS: Risk of Suicide: No Risk  Sitter no   Restraints: n/a  Sepsis Risk Score Sepsis Risk Score: 2.51    Situation  Chief Complaint   Patient presents with    Altered Mental Status     Brief Description of Patient's Condition: pt arrives via ems from dialysis, LKW was Friday as pt is MWF dialysis pt. Pt delayed when answering questions but does answer approrpriately  Mental Status: oriented and alert  Arrived from:Home  Imaging:   CT HEAD WO CONTRAST   Final Result      No evidence for acute intracranial abnormality               MRI brain without contrast    (Results Pending)     Abnormal labs:   Abnormal Labs Reviewed   CBC - Abnormal; Notable for the following components:       Result Value    RBC 4.02 (*)     Hemoglobin 11.8 (*)     Hematocrit 35.0 (*)     All other components within normal limits   COMPREHENSIVE METABOLIC PANEL - Abnormal; Notable for the following components:    Glucose 144 (*)     BUN 59 (*)     Creatinine 13.90 (*)     Bun/Cre Ratio 4 (*)     Est, Glom Filt Rate 3 (*)     Calcium 10.3 (*)     Albumin/Globulin Ratio 1.0 (*)     All other components within normal limits       Background  Allergies: No Known Allergies  History:   Past Medical History:   Diagnosis Date    Chronic kidney disease, stage 3 (HCC)     Coronary artery disease involving native coronary artery of native heart without angina pectoris     CVA (cerebral vascular accident) (HCC)     Essential hypertension     Hyperlipidemia     Sequela of cerebrovascular accident     Type 2 diabetes mellitus (HCC)        Assessment  Vitals: MEWS Score: 1  Level of Consciousness: Alert (0)   Vitals:    01/29/23 1500 01/29/23 1600 01/29/23 1720 01/29/23 1800   BP: (!) 145/54 126/72 (!) 171/78 (!) 169/97   Pulse: 81 78 95 97    Resp: Temp:    98.6 F (37 C)   TempSrc:    Oral   SpO2: 98% 99% 97% 97%   Weight:       Height:         Deterioration Index (DI): Deterioration Index: 27.38  Deterioration Index (DI) Interventions Performed:    O2 Flow Rate:    O2 Device: O2 Device: None (Room air)  Cardiac Rhythm:   Critical Lab Results: @  Cultures: Cultures:  NIH Score: NIH NIH Stroke Scale  Interval: Baseline  Level of Consciousness (1a): Alert  LOC Questions (1b): Answers both correctly  LOC Commands (1c): Performs both tasks correctly  Best Gaze (2): Normal  Visual (3): No visual loss  Facial Palsy (4): Normal symmetrical movement  Motor Arm, Left (5a): No drift  Motor Arm, Right (5b): No drift  Motor Leg, Left (6a):  No drift  Motor Leg, Right (6b): No drift  Limb Ataxia (7): Absent  Sensory (8): Normal  Best Language (9): Mild to moderate aphasia  Dysarthria (10): Normal  Extinction and Inattention (11): No abnormality  Total: 1   Active LDA's:   Peripheral IV 01/29/23 Right Antecubital (Active)     Active Central Lines: n/a  Active Foley's: pot does not make urine due to CKD  Active Feeding Tubes: n/a  Administered Medications:   Medications   apixaban (ELIQUIS) tablet 2.5 mg (has no administration in time range)   aspirin EC tablet 81 mg (has no administration in time range)   furosemide (LASIX) tablet 40 mg (has no administration in time range)   insulin glargine (LANTUS) injection vial 10 Units (has no administration in time range)   lisinopril (PRINIVIL;ZESTRIL) tablet 10 mg (has no administration in time range)   sodium chloride flush 0.9 % injection 5-40 mL (has no administration in time range)   sodium chloride flush 0.9 % injection 5-40 mL (has no administration in time range)   0.9 % sodium chloride infusion (has no administration in time range)   ondansetron (ZOFRAN-ODT) disintegrating tablet 4 mg (has no administration in time range)     Or   ondansetron (ZOFRAN) injection 4 mg (has no administration in  time range)   polyethylene glycol (GLYCOLAX) packet 17 g (has no administration in time range)   atorvastatin (LIPITOR) tablet 80 mg (has no administration in time range)   glucose chewable tablet 16 g (has no administration in time range)   dextrose bolus 10% 125 mL (has no administration in time range)     Or   dextrose bolus 10% 250 mL (has no administration in time range)   glucagon injection 1 mg (has no administration in time range)   dextrose 10 % infusion (has no administration in time range)   propylthiouracil (PTU) tablet 50 mg (has no administration in time range)     Last documented pain medication administration: n/a  Pertinent or High Risk Medications/Drips: no   If Yes, please provide details:   Blood Product Administration: no  If Yes, please provide details:   Process Protocols/Bundles:     Recommendation  Incomplete STAT orders:   Overdue Medications:   Patient Belongings:   Additional Comments: pt family at bedside  If any further questions, please call Sending RN at [phone]      Admitting Unit Notification  Name of person notified and time: 8 skye      Electronically signed by: Electronically signed by Monico Blitz, RN on 01/29/2023 at 6:21 PM

## 2023-01-29 NOTE — Progress Notes (Signed)
The pt goes to [facility] in [facility]  I have informed Dr Benson Setting and switched the nephrology consultation.

## 2023-01-29 NOTE — Progress Notes (Shared)
4 Eyes Skin Assessment     NAME:  Daniel Arellano  DATE OF BIRTH:  05/11/1941  MEDICAL RECORD NUMBER:  [id number]    The patient is being assessed for  Admission    I agree that at least one RN has performed a thorough Head to Toe Skin Assessment on the patient. ALL assessment sites listed below have been assessed.      Areas assessed by both nurses:    Head, Face, Ears, Shoulders, Back, Chest, Arms, Elbows, Hands, Sacrum. Buttock, Coccyx, Ischium, Legs. Feet and Heels, and Under Medical Devices         Does the Patient have a Wound? No noted wound(s)       Braden Prevention initiated by RN: No  Wound Care Orders initiated by RN: No    Pressure Injury (Stage 3,4, Unstageable, DTI, NWPT, and Complex wounds) if present, place Wound referral order by RN under ORDER ENTRY: No    New Ostomies, if present place, Ostomy referral order under ORDER ENTRY: No     Nurse 1 eSignature: Electronically signed by Billie Lade, RN on 01/29/23 at 11:39 PM EDT    **SHARE this note so that the co-signing nurse can place an eSignature**    Nurse 2 eSignature: {Esignature:[id number]}

## 2023-01-29 NOTE — H&P (Signed)
Department of Internal Medicine  General Internal Medicine  Attending History and Physical      CHIEF COMPLAINT: Weakness on the left side    Reason for Admission: Left-sided weakness    History Obtained From:  patient, electronic medical record    HISTORY OF PRESENT ILLNESS:      The patient is a 82 y.o. male with significant past medical history of chronic kidney disease, CAD, CVA in the past, hypertension, high cholesterol type 2 diabetes who presents with weakness on the left side which subsequently resolved patient is a poor historian    Past Medical History:        Diagnosis Date    Chronic kidney disease, stage 3 (HCC)     Coronary artery disease involving native coronary artery of native heart without angina pectoris     CVA (cerebral vascular accident) (HCC)     Essential hypertension     Hyperlipidemia     Sequela of cerebrovascular accident     Type 2 diabetes mellitus (HCC)      Past Surgical History:    History reviewed. No pertinent surgical history.  Immunizations:                Influenza:  Indicated for current flu vaccination season Oct. to Feb.            Pneumococcal Polysaccharide:  Indicated for current flu vaccination season Oct. to Feb.    Medications Prior to Admission:    Not in a hospital admission.    Allergies:  Patient has no known allergies.    Social History:   TOBACCO:   reports that he has never smoked. He has never used smokeless tobacco.  ETOH:   reports that he does not currently use alcohol.    Family History:       Problem Relation Age of Onset    Hypertension Father     Diabetes Father     Hypertension Mother     Diabetes Mother      REVIEW OF SYSTEMS:  CONSTITUTIONAL:    EYES:  negative  HEENT:  negative  RESPIRATORY:  negative  CARDIOVASCULAR:  negative  GASTROINTESTINAL:  negative  ENDOCRINE:  negative  NEUROLOGICAL:  positive for weakness  PHYSICAL EXAM:    Vitals:  BP 126/72   Pulse 78   Temp 98.5 F (36.9 C) (Oral)   Resp 15   Ht 1.778 m (5\' 10" )   Wt 68 kg (150  lb)   SpO2 99%   BMI 21.52 kg/m     CONSTITUTIONAL:  awake, alert, cooperative, no apparent distress, and appears stated age  EYES:  Lids and lashes normal, pupils equal, round and reactive to light, extra ocular muscles intact, sclera clear, conjunctiva normal  ENT:  Normocephalic, without obvious abnormality, atraumatic, sinuses nontender on palpation, external ears without lesions, oral pharynx with moist mucus membranes, tonsils without erythema or exudates, gums normal and good dentition.  NECK:  Supple, symmetrical, trachea midline, no adenopathy, thyroid symmetric, not enlarged and no tenderness, skin normal  HEMATOLOGIC/LYMPHATICS:  no cervical lymphadenopathy  LUNGS:  No increased work of breathing, good air exchange, clear to auscultation bilaterally, no crackles or wheezing  CARDIOVASCULAR:  Normal apical impulse, regular rate and rhythm, normal S1 and S2, no S3 or S4, and no murmur noted  ABDOMEN:  No scars, normal bowel sounds, soft, non-distended, non-tender, no masses palpated, no hepatosplenomegally  MUSCULOSKELETAL:  there is no redness, warmth, or swelling of the joints  NEUROLOGIC:  Awake, alert, oriented to name, place and time.  Cranial nerves II-XII are grossly intact.  Motor is 5 out of 5 bilaterally.  Cerebellar finger to nose, heel to shin intact.  Sensory is intact.  Babinski down going, Romberg negative, and gait is normal.    DATA:  CBC:   Lab Results   Component Value Date/Time    WBC 5.2 01/29/2023 11:23 AM    RBC 4.02 01/29/2023 11:23 AM    HGB 11.8 01/29/2023 11:23 AM    HCT 35.0 01/29/2023 11:23 AM    MCV 87.1 01/29/2023 11:23 AM    MCH 29.4 01/29/2023 11:23 AM    MCHC 33.7 01/29/2023 11:23 AM    RDW 14.5 01/29/2023 11:23 AM    PLT 151 01/29/2023 11:23 AM    MPV 11.3 01/29/2023 11:23 AM     CMP:    Lab Results   Component Value Date/Time    NA 136 01/29/2023 11:23 AM    K 4.4 01/29/2023 11:23 AM    CL 99 01/29/2023 11:23 AM    CO2 22 01/29/2023 11:23 AM    BUN 59 01/29/2023 11:23  AM    CREATININE 13.90 01/29/2023 11:23 AM    GFRAA 5 06/04/2021 10:01 AM    AGRATIO 1.0 01/29/2023 11:23 AM    AGRATIO 1.1 06/04/2021 10:01 AM    LABGLOM 3 01/29/2023 11:23 AM    GLUCOSE 144 01/29/2023 11:23 AM    PROT 7.4 01/29/2023 11:23 AM    LABALBU 3.7 01/29/2023 11:23 AM    CALCIUM 10.3 01/29/2023 11:23 AM    BILITOT 0.6 01/29/2023 11:23 AM    ALKPHOS 68 01/29/2023 11:23 AM    ALKPHOS 69 06/04/2021 10:01 AM    AST 28 01/29/2023 11:23 AM    ALT 13 01/29/2023 11:23 AM     HgBA1c:  No results found for: "LABA1C"  TSH:    Lab Results   Component Value Date/Time    TSH 0.02 06/01/2021 04:04 PM     ASSESSMENT AND PLAN:      Principal Problem:    Aphasia  Possible CVA continue with stroke workup  Chronic kidney disease consult nephrology for dialysis  Plan:   Hyper thyroidism start patient on PTU  Active Problems:    Acute CVA (cerebrovascular accident) (HCC)  Plan: Hypertension and diabetes  History of CAD

## 2023-01-30 ENCOUNTER — Inpatient Hospital Stay: Admit: 2023-01-30 | Payer: MEDICARE | Primary: Nephrology

## 2023-01-30 LAB — HEMOGLOBIN A1C
Estimated Avg Glucose: 146 mg/dL
Estimated Avg Glucose: 148 mg/dL
Hemoglobin A1C: 6.7 % — ABNORMAL HIGH (ref 4.0–5.6)
Hemoglobin A1C: 6.8 % — ABNORMAL HIGH (ref 4.0–5.6)

## 2023-01-30 LAB — LIPID PANEL
Chol/HDL Ratio: 5.2 — ABNORMAL HIGH (ref 0.0–5.0)
Cholesterol, Total: 134 mg/dL (ref <200)
HDL: 26 mg/dL
LDL Calculated: 48.6 mg/dL (ref 0–100)
Triglycerides: 297 mg/dL — ABNORMAL HIGH (ref <150)
VLDL Cholesterol Calculated: 59.4 mg/dL

## 2023-01-30 LAB — CBC
Hematocrit: 33.7 % — ABNORMAL LOW (ref 36.6–50.3)
Hemoglobin: 11.3 g/dL — ABNORMAL LOW (ref 12.1–17.0)
MCH: 29.3 PG (ref 26.0–34.0)
MCHC: 33.5 g/dL (ref 30.0–36.5)
MCV: 87.3 FL (ref 80.0–99.0)
MPV: 11.3 FL (ref 8.9–12.9)
Nucleated RBCs: 0 PER 100 WBC
Platelets: 154 10*3/uL (ref 150–400)
RBC: 3.86 M/uL — ABNORMAL LOW (ref 4.10–5.70)
RDW: 14.5 % (ref 11.5–14.5)
WBC: 4.9 10*3/uL (ref 4.1–11.1)
nRBC: 0 10*3/uL (ref 0.00–0.01)

## 2023-01-30 LAB — POCT GLUCOSE
POC Glucose: 156 mg/dL — ABNORMAL HIGH (ref 65–100)
POC Glucose: 127 mg/dL — ABNORMAL HIGH (ref 65–100)
POC Glucose: 132 mg/dL — ABNORMAL HIGH (ref 65–100)
POC Glucose: 159 mg/dL — ABNORMAL HIGH (ref 65–100)
POC Glucose: 104 mg/dL — ABNORMAL HIGH (ref 65–100)

## 2023-01-30 LAB — EKG 12-LEAD
Atrial Rate: 102 {beats}/min
P Axis: 34 degrees
P-R Interval: 214 ms
Q-T Interval: 346 ms
QRS Duration: 84 ms
QTc Calculation (Bazett): 450 ms
R Axis: -25 degrees
T Axis: 62 degrees
Ventricular Rate: 102 {beats}/min

## 2023-01-30 LAB — LDL CHOLESTEROL, DIRECT: LDL Direct: 63 mg/dL (ref 0–100)

## 2023-01-30 LAB — TROPONIN: Troponin, High Sensitivity: 456 ng/L (ref 0–76)

## 2023-01-30 MED ORDER — METOPROLOL SUCCINATE ER 25 MG PO TB24
25 | Freq: Every day | ORAL | Status: DC
Start: 2023-01-30 — End: 2023-01-31
  Administered 2023-01-31: 13:00:00 25 mg via ORAL

## 2023-01-30 MED ORDER — IOPAMIDOL 76 % IV SOLN
76 | Freq: Once | INTRAVENOUS | Status: AC | PRN
Start: 2023-01-30 — End: 2023-01-30
  Administered 2023-01-30: 17:00:00 100 mL via INTRAVENOUS

## 2023-01-30 MED FILL — ISOVUE-370 76 % IV SOLN: 76 % | INTRAVENOUS | Qty: 100

## 2023-01-30 MED FILL — PROPYLTHIOURACIL 50 MG PO TABS: 50 MG | ORAL | Qty: 1

## 2023-01-30 MED FILL — ELIQUIS 2.5 MG PO TABS: 2.5 MG | ORAL | Qty: 1

## 2023-01-30 MED FILL — FUROSEMIDE 40 MG PO TABS: 40 MG | ORAL | Qty: 1

## 2023-01-30 MED FILL — ASPIRIN ADULT LOW STRENGTH 81 MG PO TBEC: 81 MG | ORAL | Qty: 1

## 2023-01-30 MED FILL — ATORVASTATIN CALCIUM 40 MG PO TABS: 40 MG | ORAL | Qty: 2

## 2023-01-30 MED FILL — LANTUS 100 UNIT/ML SC SOLN: 100 UNIT/ML | SUBCUTANEOUS | Qty: 10

## 2023-01-30 NOTE — Consults (Signed)
01/30/2023, 5:24 PM        [facility]  [address] [place], [place] [address]  Phone: [phone]      Cardiology Consult      01/30/2023, 5:24 PM      Daniel Arellano  82 y.o. male  [birth year]      Admit Date:  01/29/2023    Primary Cardiologist:  Dr. Jomarie Longs  Reason for consult: Elevated troponin  Requesting provider: Dr. Court Joy    Impression:      Patient's mildly elevated troponin in absence of anginal symptoms appears to be type II non-STEMI with poor renal clearance.  Patient's cardiac catheterization in 8 of 2022 for similar presentation showed nonrevascularizable distal LAD disease and nonobstructive disease of RCA-PDA.  Paroxysmal atrial fibrillation.  Patient currently in sinus rhythm  History of CVA with left-sided weakness  Diabetes mellitus  Hypertension  ESRD with HD     Plan:      Continue aspirin and high-dose statin with beta-blocker as tolerated by blood pressure for empiric Myocardial Protection.  Continue beta-blocker for rate control of paroxysmal atrial fibrillation with anticoagulation by Eliquis.  Patient's echocardiogram in past has shown preserved LV systolic function without any significant valvular heart disease.  No further cardiac workup at this juncture is recommended.    Subjective   This is an 82 year old man with history of CVA, paroxysmal atrial fibrillation, nonrevascularizable distal LAD disease as per cardiac cath of 05/02/2021, hypertension, diabetes mellitus and end-stage renal disease with hemodialysis dependency who presented to emergency room with complaints of altered mental status with confusion and left lower extremity weakness.  Patient with the symptoms early on presented to dialysis center from where he was referred to the ER.  Cardiology now has been consulted due to elevated troponin of 456.  The patient is a poor historian however on pointed questioning denies any complaints of any chest pain, palpitations, chest fluttering, dizziness or unusual  dyspnea.        Past medical, Family & Social History   The following medical history was reviewed    Past Medical History:   Past Medical History:   Diagnosis Date    Chronic kidney disease, stage 3 (HCC)     Coronary artery disease involving native coronary artery of native heart without angina pectoris     CVA (cerebral vascular accident) (HCC)     Essential hypertension     Hyperlipidemia     Sequela of cerebrovascular accident     Type 2 diabetes mellitus (HCC)       History reviewed. No pertinent surgical history.    Social History:  Social History     Socioeconomic History    Marital status: Single     Spouse name: Not on file    Number of children: Not on file    Years of education: Not on file    Highest education level: Not on file   Occupational History    Not on file   Tobacco Use    Smoking status: Never    Smokeless tobacco: Never   Substance and Sexual Activity    Alcohol use: Not Currently    Drug use: Never    Sexual activity: Not on file   Other Topics Concern    Not on file   Social History Narrative    Not on file     Social Determinants of Health     Financial Resource Strain: Not on file   Food  Insecurity: No Food Insecurity (01/29/2023)    Hunger Vital Sign     Worried About Running Out of Food in the Last Year: Never true     Ran Out of Food in the Last Year: Never true   Transportation Needs: No Transportation Needs (01/29/2023)    PRAPARE - Therapist, art (Medical): No     Lack of Transportation (Non-Medical): No   Physical Activity: Not on file   Stress: Not on file   Social Connections: Not on file   Intimate Partner Violence: Not on file   Housing Stability: Low Risk  (01/29/2023)    Housing Stability Vital Sign     Unable to Pay for Housing in the Last Year: No     Number of Places Lived in the Last Year: 1     Unstable Housing in the Last Year: No       Family History:   Family History   Problem Relation Age of Onset    Hypertension Father     Diabetes Father      Hypertension Mother     Diabetes Mother        Medications & Allergies     Allergies:   No Known Allergies    Home Medications:  Prior to Admission medications    Medication Sig Start Date End Date Taking? Authorizing Provider   apixaban (ELIQUIS) 2.5 MG TABS tablet Take 1 tablet by mouth 2 times daily 06/04/21   Automatic Reconciliation, Ar   aspirin 81 MG EC tablet Take 1 tablet by mouth daily    Automatic Reconciliation, Ar   atorvastatin (LIPITOR) 40 MG tablet Take 1 tablet by mouth daily 06/04/21   Automatic Reconciliation, Ar   furosemide (LASIX) 40 MG tablet Take 1 tablet by mouth daily 06/04/21   Automatic Reconciliation, Ar   insulin detemir (LEVEMIR) 100 UNIT/ML injection vial Inject 15 Units into the skin 10/07/18   Automatic Reconciliation, Ar   lisinopril (PRINIVIL;ZESTRIL) 10 MG tablet Take 1 tablet by mouth daily 06/05/21   Automatic Reconciliation, Ar   metoprolol succinate (TOPROL XL) 100 MG extended release tablet Take 1 tablet by mouth daily 06/04/21   Automatic Reconciliation, Ar           REVIEW OF SYSTEMS    Unremarkable except as mentioned in history of present illness      Objective       Vitals, I/O's, Weight     24 hour vital signs  BP  Min: 103/54  Max: 169/97  Temp  Avg: 98.2 F (36.8 C)  Min: 97.5 F (36.4 C)  Max: 99.3 F (37.4 C)  Read-Only, Retired: [employer] could not be evaluated. This SmartLink does not work with rows of the type: String Type  Resp  Avg: 17.9  Min: 14  Max: 19  SpO2  Avg: 97.7 %  Min: 94 %  Max: 99 %  Systolic (24hrs), Avg:128 , Min:103 , Max:169   Diastolic (24hrs), Avg:67, Min:48, Max:99    Body mass index is 24.26 kg/m.    Intake/Output Summary (Last 24 hours) at 01/30/2023 1724  Last data filed at 01/30/2023 1630  Gross per 24 hour   Intake 600 ml   Output 2600 ml   Net -2000 ml     Patient Vitals for the past 120 hrs:   Weight   01/29/23 1110 68 kg (150 lb)   01/29/23 2015 76.7 kg (169 lb 1.5 oz)  Admit weight: Weight - Scale:  68 kg (150 lb)      Exam       Constitutional Generally well without symptoms, No weight loss, no fever, chills or nausea or vominting, and Well developed, well nourished in no apparent distress   HEENT normal atraumatic, no neck masses, normal thyroid   Cardiovascular RRR.  Normal S1/S2.  No M/R/G.both carotids normal upstroke without bruits, radial pulses normal, pedal pulses normal both DP's and PT's LE edema Trace   Pulmonary clear to auscultation bilaterally   Abnominal soft, non-tender, without masses or organomegaly, normal bowel sounds, and no masses palpated   Genitourinary Deferred   Muskuloskeletal No obvious joint deformities.  No clubbing   Neuro Alert and oriented, Non focal neurologic exam   Psychiatric Demonstrates good judgement and insight into his or her medical condition   Extremities warm, well perfused   Skin Warm and dry         Labs     Lab Results   Component Value Date/Time    WBC 4.9 01/30/2023 07:58 AM    HGB 11.3 01/30/2023 07:58 AM    HCT 33.7 01/30/2023 07:58 AM    PLT 154 01/30/2023 07:58 AM    MCV 87.3 01/30/2023 07:58 AM     Lab Results   Component Value Date/Time    NA 136 01/29/2023 11:23 AM    K 4.4 01/29/2023 11:23 AM    CL 99 01/29/2023 11:23 AM    CO2 22 01/29/2023 11:23 AM    BUN 59 01/29/2023 11:23 AM    GFRAA 5 06/04/2021 10:01 AM      Last Lipid:    Lab Results   Component Value Date/Time    CHOL 134 01/30/2023 07:58 AM    HDL 26 01/30/2023 07:58 AM     No results found for: "CPK", "RCK1", "CKMB", "BNP"   Lab Results   Component Value Date/Time    TSH 0.02 06/01/2021 04:04 PM    FT3 2.5 06/01/2021 07:48 PM          Imaging & Acillary Studies     Last EKG    Encounter Date: 01/29/23   EKG 12 Lead   Result Value    Ventricular Rate 102    Atrial Rate 102    P-R Interval 214    QRS Duration 84    Q-T Interval 346    QTc Calculation (Bazett) 450    P Axis 34    R Axis -25    T Axis 62    Diagnosis      Sinus tachycardia with 1st degree A-V block with occasional Premature    ventricular complexes and Fusion complexes  Anterior infarct , age undetermined  Abnormal ECG  When compared with ECG of 03-Jun-2021 09:20,  PR interval has increased  Anterior infarct is now Present  Nonspecific T wave abnormality no longer evident in Inferior leads  Confirmed by Jafferani, Asif ([id number]) on 01/29/2023 10:11:29 PM          CT LUMBAR SPINE W CONTRAST   Final Result   1.  Multilevel spondylitic changes with L4-L5 grade 1 anterolisthesis and mild   dextroscoliosis. Multilevel spinal canal stenosis, most severe at L4-L5.   Multilevel foraminal stenosis, most severe at L3-L4 on the right.   2.  Partially visualized large hiatal hernia, correlate with symptomatology.   3.  Nonobstructing left nephrolithiasis.   4.  Findings which can be seen with cystitis.   5.  Additional incidental findings as outlined above.               MRI brain without contrast   Final Result   No acute process. Chronic white matter disease and areas of prior infarction as   above.         CT HEAD WO CONTRAST   Final Result      No evidence for acute intracranial abnormality                   Cardiac cath, 06-03-21:    This is a summary report. The complete report is available in the patient's medical record. If you cannot access the medical record, please contact the sending organization for a detailed fax or copy.      Transradial band for hemostasis of right radial access.     Indication troponin leak, abnormal Cardiolite stress test, atrial fibrillation     Low systemic pressures due to intra-arterial vasodilators.  Patent left main, severely calcific LAD with mid to distal 50 to 60% disease and very distal LAD acid wraps around the left ventricular apex with up to 70% disease.  Nondominant and circumflex with nonobstructive disease.  Dominant right coronary artery with mid PDA branch with up to 50% disease.     Continue medical management and risk modification.       Echo:     No results found for this or any previous  visit.        Total time spent:  60 minutes.      Dr. Donne Anon. Blinda Leatherwood, MD.PhD. [employer] [facility]

## 2023-01-30 NOTE — Care Coordination-Inpatient (Addendum)
01/30/23 0745   Service Assessment   Patient Orientation Person;Situation   Cognition Alert   History Provided By Patient   Primary Caregiver Self   Support Systems Children   Patient's Healthcare Decision Maker is: Legal Next of Kin   PCP Verified by CM No  (PCP listed as nephrologist, Dr. Benson Setting)   Last Visit to PCP Within last two years   Prior Functional Level Mobility   Current Functional Level Mobility   Can patient return to prior living arrangement Unknown at present   Ability to make needs known: Fair   Family able to assist with home care needs: Other (comment)  (Patient reports living with his brother.)   Would you like for me to discuss the discharge plan with any other family members/significant others, and if so, who? Yes  (Daughter, Public relations account executive)   Event organiser Resources None   Social/Functional History   Lives With Family   Type of Home House   Home Layout Two level  (3 steps to the entrance)   Scientist, research (medical), Garment/textile technologist At/After Discharge   Transition of Care Consult (CM Consult) Discharge Planning   Confirm Follow Up Transport Family     0745: CM met with patient at bedside to complete DCP assessment. Demos verified as accurate. Patient reports living with his brother in a two story home with three steps to the entrance. Prior to admission, patient relays being independent with ADLS. SW consult stating patient cannot take care of himself, per daughter. CM to follow-up with daughter. DME: BSC, cane, rolling walker, rollator, shower chair. No previous HH/IRF/SNF. Preferred pharmacy verified as [facility] in [place]. CM will continue to follow patient and recs of medical team.    Current Dispo: Pending therapy evals/recs.    Advance Care Planning     General Advance Care Planning (ACP) Conversation    Date of Conversation: 01/30/2023  Conducted with: Patient with Decision Making  Capacity    Healthcare Decision Maker:    Primary Decision Maker: Ursula Alert - Child - [phone]  Click here to complete Healthcare Decision Makers including selection of the Healthcare Decision Maker Relationship (ie "Primary").   Today we documented Decision Maker(s) consistent with Legal Next of Kin hierarchy.    Content/Action Overview:  Has NO ACP documents-Information provided  Reviewed DNR/DNI and patient elects Full Code (Attempt Resuscitation)        Length of Voluntary ACP Conversation in minutes:  <16 minutes (Non-Billable)    Elea Holtzclaw

## 2023-01-30 NOTE — Other (Signed)
Completed 3.5 hour dialysis and tolerated 2L fluid removal.    Consuella Lose, RN received dialysis report.

## 2023-01-30 NOTE — Progress Notes (Signed)
Department of Internal Medicine  General Internal Medicine  Attending Progress Note      SUBJECTIVE: No new complaints patient is waiting for MRI nephrology for dialysis management    OBJECTIVE      Medications    Current Facility-Administered Medications: apixaban (ELIQUIS) tablet 2.5 mg, 2.5 mg, Oral, BID  aspirin EC tablet 81 mg, 81 mg, Oral, Daily  furosemide (LASIX) tablet 40 mg, 40 mg, Oral, Daily  insulin glargine (LANTUS) injection vial 10 Units, 10 Units, SubCUTAneous, Nightly  lisinopril (PRINIVIL;ZESTRIL) tablet 10 mg, 10 mg, Oral, Daily  sodium chloride flush 0.9 % injection 5-40 mL, 5-40 mL, IntraVENous, 2 times per day  sodium chloride flush 0.9 % injection 5-40 mL, 5-40 mL, IntraVENous, PRN  0.9 % sodium chloride infusion, , IntraVENous, PRN  ondansetron (ZOFRAN-ODT) disintegrating tablet 4 mg, 4 mg, Oral, Q8H PRN **OR** ondansetron (ZOFRAN) injection 4 mg, 4 mg, IntraVENous, Q6H PRN  polyethylene glycol (GLYCOLAX) packet 17 g, 17 g, Oral, Daily PRN  atorvastatin (LIPITOR) tablet 80 mg, 80 mg, Oral, Nightly  glucose chewable tablet 16 g, 4 tablet, Oral, PRN  dextrose bolus 10% 125 mL, 125 mL, IntraVENous, PRN **OR** dextrose bolus 10% 250 mL, 250 mL, IntraVENous, PRN  glucagon injection 1 mg, 1 mg, SubCUTAneous, PRN  dextrose 10 % infusion, , IntraVENous, Continuous PRN  propylthiouracil (PTU) tablet 50 mg, 50 mg, Oral, 3 times per day  Physical    VITALS:  BP (!) 117/51   Pulse 66   Temp 97.7 F (36.5 C) (Oral)   Resp 19   Ht 1.778 m ( )   Wt 76.7 kg (169 lb 1.5 oz)   SpO2 99%   BMI 24.26 kg/m   BLOOD PRESSURE RANGE:  Systolic (24hrs), Avg:144 , Min:117 , Max:171  ; Diastolic (24hrs), Avg:74, Min:48, Max:99   CONSTITUTIONAL:  awake, alert, cooperative, no apparent distress, and appears stated age  EYES:  Lids and lashes normal, pupils equal, round and reactive to light, extra ocular muscles intact, sclera clear, conjunctiva normal  ENT:  Normocephalic, without obvious abnormality,  atraumatic, sinuses nontender on palpation, external ears without lesions, oral pharynx with moist mucus membranes, tonsils without erythema or exudates, gums normal and good dentition.  NECK:  Supple, symmetrical, trachea midline, no adenopathy, thyroid symmetric, not enlarged and no tenderness, skin normal  HEMATOLOGIC/LYMPHATICS:  no cervical lymphadenopathy  LUNGS:  No increased work of breathing, good air exchange, clear to auscultation bilaterally, no crackles or wheezing  CARDIOVASCULAR:  Normal apical impulse, regular rate and rhythm, normal S1 and S2, no S3 or S4, and no murmur noted  ABDOMEN:  No scars, normal bowel sounds, soft, non-distended, non-tender, no masses palpated, no hepatosplenomegally  MUSCULOSKELETAL:  there is no redness, warmth, or swelling of the joints  NEUROLOGIC:  Awake, alert, oriented to name, place and time.  Cranial nerves II-XII are grossly intact.   y.   Decree strength in lower extremities bilaterally but improved slightly  SKIN:  no bruising or bleeding  Data    CBC:   Lab Results   Component Value Date/Time    WBC 4.9 01/30/2023 07:58 AM    RBC 3.86 01/30/2023 07:58 AM    HGB 11.3 01/30/2023 07:58 AM    HCT 33.7 01/30/2023 07:58 AM    MCV 87.3 01/30/2023 07:58 AM    MCH 29.3 01/30/2023 07:58 AM    MCHC 33.5 01/30/2023 07:58 AM    RDW 14.5 01/30/2023 07:58 AM    PLT 154 01/30/2023 07:58  AM    MPV 11.3 01/30/2023 07:58 AM     CMP:    Lab Results   Component Value Date/Time    NA 136 01/29/2023 11:23 AM    K 4.4 01/29/2023 11:23 AM    CL 99 01/29/2023 11:23 AM    CO2 22 01/29/2023 11:23 AM    BUN 59 01/29/2023 11:23 AM    CREATININE 13.90 01/29/2023 11:23 AM    GFRAA 5 06/04/2021 10:01 AM    AGRATIO 1.0 01/29/2023 11:23 AM    AGRATIO 1.1 06/04/2021 10:01 AM    LABGLOM 3 01/29/2023 11:23 AM    GLUCOSE 144 01/29/2023 11:23 AM    PROT 7.4 01/29/2023 11:23 AM    LABALBU 3.7 01/29/2023 11:23 AM    CALCIUM 10.3 01/29/2023 11:23 AM    BILITOT 0.6 01/29/2023 11:23 AM    ALKPHOS 68  01/29/2023 11:23 AM    ALKPHOS 69 06/04/2021 10:01 AM    AST 28 01/29/2023 11:23 AM    ALT 13 01/29/2023 11:23 AM       ASSESSMENT AND PLAN      Principal Problem:    Aphasia  Plan: MRI of the brain 1 evaluation for CVA  Active Problems:    Acute CVA (cerebrovascular accident) (HCC)  Plan:     Possible lumbosacral stenosis CT scan of lumbosacral spine  PT and OT, speech therapist  Case management for placement

## 2023-01-30 NOTE — Progress Notes (Addendum)
Speech LAnguage Pathology Dysphagia EVALUATION    Patient: Daniel Arellano (82 y.o. male)  Date: 01/30/2023  Primary Diagnosis: Aphasia [R47.01]  Acute CVA (cerebrovascular accident) (HCC) [I63.9]  Altered mental status, unspecified altered mental status type [R41.82]  Chronic kidney disease, unspecified CKD stage [N18.9]       Precautions: Fall                  DIET RECOMMENDATIONS: Regular and thin liquids, meds as tolerated,    SWALLOW SAFETY PRECAUTIONS: Rec slow rate of intake, small bites/sips, effortful swallow, and remain upright 30 minutes after PO intake.       ASSESSMENT :  Based on the objective data described below, the patient presents with wfl oropharyngeal swallow fxn.  No facial droop or dysarthria. Informally, speech language is wfl. Patient presents w/ cognitive deficits w/ impairments in attention, memory, processing, and safety awareness. Delayed processing and response time.  Oral phase c/b good bolus control and manipulation. Pharyngeal phase c/b timely swallow and HLE is wfl upon palpation.   No overt s/s of pen/asp observed.  Patient is fall risk s/t cognitive impairments.     F/u: MRI head neg. He may benefit from [facility] ST eval for cognition     PLAN :  Recommendations and Planned Interventions:  DIET RECOMMENDATIONS: Regular and thin liquids, meds as tolerated,    SWALLOW SAFETY PRECAUTIONS: Rec slow rate of intake, small bites/sips, effortful swallow, and remain upright 30 minutes after PO intake.    Cog eval pending baseline fxn.  Acute SLP Services: No further acute care needs.     Discharge Recommendations: Home w/ 24/7      SUBJECTIVE:   "I dont know why I'm  here"    OBJECTIVE:   Patient admitted w/ AMS, aphasia and unsteady gait.   MRI brain without contrast   Final Result   No acute process. Chronic white matter disease and areas of prior infarction as   above.         CT HEAD WO CONTRAST   Final Result      No evidence for acute intracranial abnormality               CT LUMBAR SPINE W  WO CONTRAST    (Results Pending)       Past Medical History:   Diagnosis Date    Chronic kidney disease, stage 3 (HCC)     Coronary artery disease involving native coronary artery of native heart without angina pectoris     CVA (cerebral vascular accident) (HCC)     Essential hypertension     Hyperlipidemia     Sequela of cerebrovascular accident     Type 2 diabetes mellitus (HCC)    History reviewed. No pertinent surgical history.  Prior Level of Function/Home Situation:   Social/Functional History  Lives With: Family  Type of Home: House  Home Layout: Two level  Home Access: Stairs to enter with rails  Entrance Stairs - Number of Steps: 2  Bathroom Shower/Tub: Pension scheme manager: Dance movement psychotherapist: Buyer, retail: Medical laboratory scientific officer  ADL Assistance: Independent  Homemaking Assistance: Independent  Ambulation Assistance: Independent  Transfer Assistance: International aid/development worker and Communication Status:  Neurologic State: Alert  Orientation Level: Oriented to person and Oriented to place  Cognition: Follows commands, Decreased attention/concentration, Impaired decision making, Memory loss, and Poor safety awareness    Baseline Assessment:  Current Diet : Minced and Moist  Current Liquid Diet : Thin  Prior Dysphagia History: denies     Hearing: impaired     General:         O2 Device: None (Room air)     Current Diet : Minced and Moist  Current Liquid Diet : Thin  Dentition: Some missing teeth  Patient Positioning: Upright in bed    Dysphagia:  Oral Assessment:  Oral Motor   Labial: No impairment  Dentition: Limited  Oral Hygiene: Moist  Lingual: No impairment  Velum: No Impairment  Mandible: No impairment  P.O. Trials:  Consistencies Administered: Regular;Pureed;Thin - cup;Thin - straw      Voice:   WFL    After Treatment:  Patient left in no apparent distress in bed, Call bell left within reach, Nursing notified, Bed alarm activated, and Updated patient's board with:  diet  recommendations     Pain:  VAS (numerical) 0    COMMUNICATION/EDUCATION:   BE FAST acronym for signs/symptoms of CVA and TIA, Swallow safety precautions, Aspiration precautions, GERD precautions, and Diet recommendations education provided to Patient and Family via explanation and teach back, all questions/concerns addressed. Patient requires reinforcement.    The patient's plan of care including recommendations, planned interventions, and recommended diet changes were discussed with: Occupational therapist and Registered nurse.    Thank you,  Gershon Mussel, M.S., M.Ed., CCC-SLP  Minutes: 15

## 2023-01-30 NOTE — Consults (Addendum)
NAME:  Daniel Arellano   DOB:   1940-10-19   MRN:   [id number]     ATTENDING: Blake Divine, MD  PCP:  Florestine Avers, MD    Date/Time:  01/30/2023       Subjective:   REQUESTING PHYSICIAN: Dr. Court Joy  REASON FOR CONSULT:  ESRD on HD    History of presenting illness:    [name] is an 82 year old male with past medical history including ESRD on HD MWF at [facility], CAD, CVA, HTN, and diabetes mellitus presented to the emergency room with report of altered mental status. Information was gathered from medical records since patient is unable to provide details due to aphasia and altered mental status. It was reported that patient presented in normal form to dialysis on Friday; however, he was more confused and using a cane to ambulate at dialysis on Monday.     Initial labs showed BUN and creatinine of 59 and 13.9. Electrolytes were stable. No new labs today. Patient has remained hemodynamically stable. CT head negative. MRI of brain showed chronic white matter disease and areas of prior infarction.    Patient was seen and examined while in HD. He was awake, alert, and able to answer simple questions appropriately. He is in no acute distress. Denies any complaints currently, on RA, no swelling in extremities.     Past Medical History:   Diagnosis Date    Chronic kidney disease, stage 3 (HCC)     Coronary artery disease involving native coronary artery of native heart without angina pectoris     CVA (cerebral vascular accident) (HCC)     Essential hypertension     Hyperlipidemia     Sequela of cerebrovascular accident     Type 2 diabetes mellitus (HCC)       History reviewed. No pertinent surgical history.  Social History     Tobacco Use    Smoking status: Never    Smokeless tobacco: Never   Substance Use Topics    Alcohol use: Not Currently      Family History   Problem Relation Age of Onset    Hypertension Father     Diabetes Father     Hypertension Mother     Diabetes Mother        No Known Allergies    Prior to Admission medications    Medication Sig Start Date End Date Taking? Authorizing Provider   apixaban (ELIQUIS) 2.5 MG TABS tablet Take 1 tablet by mouth 2 times daily 06/04/21   Automatic Reconciliation, Ar   aspirin 81 MG EC tablet Take 1 tablet by mouth daily    Automatic Reconciliation, Ar   atorvastatin (LIPITOR) 40 MG tablet Take 1 tablet by mouth daily 06/04/21   Automatic Reconciliation, Ar   furosemide (LASIX) 40 MG tablet Take 1 tablet by mouth daily 06/04/21   Automatic Reconciliation, Ar   insulin detemir (LEVEMIR) 100 UNIT/ML injection vial Inject 15 Units into the skin 10/07/18   Automatic Reconciliation, Ar   lisinopril (PRINIVIL;ZESTRIL) 10 MG tablet Take 1 tablet by mouth daily 06/05/21   Automatic Reconciliation, Ar   metoprolol succinate (TOPROL XL) 100 MG extended release tablet Take 1 tablet by mouth daily 06/04/21   Automatic Reconciliation, Ar     Current Facility-Administered Medications   Medication Dose Route Frequency    apixaban (ELIQUIS) tablet 2.5 mg  2.5 mg Oral BID    aspirin EC tablet 81 mg  81 mg Oral Daily  furosemide (LASIX) tablet 40 mg  40 mg Oral Daily    insulin glargine (LANTUS) injection vial 10 Units  10 Units SubCUTAneous Nightly    lisinopril (PRINIVIL;ZESTRIL) tablet 10 mg  10 mg Oral Daily    sodium chloride flush 0.9 % injection 5-40 mL  5-40 mL IntraVENous 2 times per day    sodium chloride flush 0.9 % injection 5-40 mL  5-40 mL IntraVENous PRN    0.9 % sodium chloride infusion   IntraVENous PRN    ondansetron (ZOFRAN-ODT) disintegrating tablet 4 mg  4 mg Oral Q8H PRN    Or    ondansetron (ZOFRAN) injection 4 mg  4 mg IntraVENous Q6H PRN    polyethylene glycol (GLYCOLAX) packet 17 g  17 g Oral Daily PRN    atorvastatin (LIPITOR) tablet 80 mg  80 mg Oral Nightly    glucose chewable tablet 16 g  4 tablet Oral PRN    dextrose bolus 10% 125 mL  125 mL IntraVENous PRN    Or    dextrose bolus 10% 250 mL  250 mL IntraVENous PRN    glucagon injection 1 mg  1 mg  SubCUTAneous PRN    dextrose 10 % infusion   IntraVENous Continuous PRN    propylthiouracil (PTU) tablet 50 mg  50 mg Oral 3 times per day       REVIEW OF SYSTEMS:     Complaints as mentioned in the history of presenting illness.   No other significant complaints on complete review of systems.    Objective:   VITALS:    BP (!) 135/58   Pulse 77   Temp 97.9 F (36.6 C) (Oral)   Resp 18   Ht 1.778 m ( )   Wt 76.7 kg (169 lb 1.5 oz)   SpO2 94%   BMI 24.26 kg/m   Temp (24hrs), Avg:98.1 F (36.7 C), Min:97.5 F (36.4 C), Max:99.3 F (37.4 C)      PHYSICAL EXAM:   General: alert, awake, confused, no acute distress.  HEENT: EOMI, no icterus, no pallor, pupils reactive, mucosa moist,   Neck: Neck is supple, No JVD.  Lungs: breathsounds normal, no respiratory distress on inspection, no rhonchi, no rales.  CVS: heart sounds normal, regular rate and rhythm.  GI: soft, nontender, normal BS.  Extremities: no clubbing, no cyanosis, no edema.  Neuro: Alert, awake, with expressive aphasia, confusion+ moving all extremities.  Skin: normal skin turgor, no skin rashes   Musculoskeletal: no acute joint swellings.    LAB DATA REVIEWED:    Recent Results (from the past 24 hour(s))   POCT Glucose    Collection Time: 01/29/23  8:38 PM   Result Value Ref Range    POC Glucose 156 (H) 65 - 100 mg/dL    Performed by: [employer]    POCT Glucose    Collection Time: 01/30/23  3:00 AM   Result Value Ref Range    POC Glucose 127 (H) 65 - 100 mg/dL    Performed by: Bonna Gains    Troponin    Collection Time: 01/30/23  7:58 AM   Result Value Ref Range    Troponin, High Sensitivity 456 (HH) 0 - 76 ng/L   CBC    Collection Time: 01/30/23  7:58 AM   Result Value Ref Range    WBC 4.9 4.1 - 11.1 K/uL    RBC 3.86 (L) 4.10 - 5.70 M/uL    Hemoglobin 11.3 (L) 12.1 - 17.0 g/dL  Hematocrit 33.7 (L) 36.6 - 50.3 %    MCV 87.3 80.0 - 99.0 FL    MCH 29.3 26.0 - 34.0 PG    MCHC 33.5 30.0 - 36.5 g/dL    RDW 16.1 09.6 - 04.5 %    Platelets 154  150 - 400 K/uL    MPV 11.3 8.9 - 12.9 FL    Nucleated RBCs 0.0 0.0 PER 100 WBC    nRBC 0.00 0.00 - 0.01 K/uL   Hemoglobin A1c    Collection Time: 01/30/23  7:58 AM   Result Value Ref Range    Hemoglobin A1C 6.8 (H) 4.0 - 5.6 %    Estimated Avg Glucose 148 mg/dL   POCT Glucose    Collection Time: 01/30/23  8:10 AM   Result Value Ref Range    POC Glucose 132 (H) 65 - 100 mg/dL    Performed by: Charlott Rakes    POCT Glucose    Collection Time: 01/30/23 11:16 AM   Result Value Ref Range    POC Glucose 159 (H) 65 - 100 mg/dL    Performed by: Charlott Rakes        Assessment and plan:     End-stage renal disease on hemodialysis: on MWF at [facility]  -Last hemodialysis as outpatient was on Friday  -No complaints of any fluid overload,   -Stable electrolytes, hemodynamically stable  -Inpatient hemodialysis arranged for today, patient seen on hemodialysis, tolerating well, hemodynamically stable  -We will continue maintenance hemodialysis on MWF    2. Hypertension: Blood pressures soft today  -On lisinopril and Lasix 40 mg daily  -Will hold lisinopril to prevent intradialytic hypotension  -Continue current blood pressure medications and continue to monitor blood pressures  -Advised salt restricted diet    3. Anemia: Stable hemoglobin  -Continue to monitor H&H    4. Renal osteodystrophy:   -Corrected calcium 10.5  -We will check phosphorus level and adjust binders as needed  -Secondary hyperparathyroidism: Continue as per outpatient protocol    5.. Diabetes mellitus: controlled  -Accuchecks and sliding scale, management per primary    7. Altered mental status  -CT head and MRI brain negative  -Neurology following    7. Elevated troponin/A-fib  -Cardiology following  -On Eliquis    Signed: Lennie Hummer, APRN - CNP    Addendum:  Rounds made along with Lennie Hummer, NP  Patient seen and examined at bedside and HD,    Labs and treatments reviewed   Plan discussed with patient and NP   Reviewed NP's  notes, amended and agree  with assessment and plan    Dr. Patsey Berthold

## 2023-01-30 NOTE — Plan of Care (Signed)
Problem: Discharge Planning  Goal: Discharge to home or other facility with appropriate resources  Outcome: Progressing     Problem: Pain  Goal: Verbalizes/displays adequate comfort level or baseline comfort level  Outcome: Progressing     Problem: Chronic Conditions and Co-morbidities  Goal: Patient's chronic conditions and co-morbidity symptoms are monitored and maintained or improved  Outcome: Progressing     Problem: ABCDS Injury Assessment  Goal: Absence of physical injury  Outcome: Progressing  Flowsheets (Taken 01/29/2023 2015)  Absence of Physical Injury: Implement safety measures based on patient assessment     Problem: Confusion  Goal: Confusion, delirium, dementia, or psychosis is improved or at baseline  Description: INTERVENTIONS:  1. Assess for possible contributors to thought disturbance, including medications, impaired vision or hearing, underlying metabolic abnormalities, dehydration, psychiatric diagnoses, and notify attending LIP  2. Institute high risk fall precautions, as indicated  3. Provide frequent short contacts to provide reality reorientation, refocusing and direction  4. Decrease environmental stimuli, including noise as appropriate  5. Monitor and intervene to maintain adequate nutrition, hydration, elimination, sleep and activity  6. If unable to ensure safety without constant attention obtain sitter and review sitter guidelines with assigned personnel  7. Initiate Psychosocial CNS and Spiritual Care consult, as indicated  Outcome: Progressing     Problem: Safety - Adult  Goal: Free from fall injury  Outcome: Progressing

## 2023-01-30 NOTE — Progress Notes (Signed)
Troponin 456, called to Dr Court Joy. Orders received to consult cardiology

## 2023-01-30 NOTE — Care Coordination-Inpatient (Signed)
DCP: home self care (default), new admission; CM to assess for dc needs

## 2023-01-30 NOTE — Progress Notes (Signed)
OCCUPATIONAL THERAPY EVALUATION  Patient: Daniel Arellano (82 y.o. male)  Date: 01/30/2023  Primary Diagnosis: Aphasia [R47.01]  Acute CVA (cerebrovascular accident) (HCC) [I63.9]  Altered mental status, unspecified altered mental status type [R41.82]  Chronic kidney disease, unspecified CKD stage [N18.9]       Precautions: Fall Risk, General Precautions                Recommendations for nursing mobility: Out of bed to chair for meals, Use of bed/chair alarm for safety, AD and gt belt for bed to chair , Amb to bathroom with AD and gait belt, and Assist x1    In place during session:EKG/telemetry   ASSESSMENT  Pt is a 82 y.o. male presenting to [facility] with c/o left sided weakness, admitted 01/29/23 and currently being treated for CVA work up. Head CT negative for acute infarct. Pt received semi-supine in bed upon arrival, AXO x person and month only, and agreeable to OT evaluation.     Based on current observations, pt presents with decreased  functional mobility, independence in ADLs, high-level IADLs, strength, activity tolerance, endurance, safety awareness, cognition, balance (see below for objective details and assist levels).     Overall, pt tolerates session fair with 10/10 pain in lower back. Pt completed bed mobility with min a for LB management. Pt with no c/o of dizziness. Sit to stand with RW with CGA for safety. Pt able to mobilize into hallway with increased verbal cues for sequencing of RW and motor planning. Pt unable to turn RW without tactile assist due to RLE staying in extension, pt presents with bouts of being "stuck" when walking. Pt with decreased safety awareness requiring verbal cues. Pt reutnred to bed side, md in room address medical status. Pt returned back to semi supine with verbal cues for side lying to decrease back pain. Pt left with all need met.. Pt will benefit from continued skilled OT services to address current impairments and improve IND and safety with self cares and functional  transfers/mobility. Potential barriers for safe discharge: pt has poor safety awareness and pt is a high fall risk. Current OT d/c recommendation Home with Home Health Therapy and family assistonce medically appropriate.      GOALS:    Problem: Occupational Therapy - Adult  Goal: By Discharge: Performs self-care activities at highest level of function for planned discharge setting.  See evaluation for individualized goals.  Description: FUNCTIONAL STATUS PRIOR TO ADMISSION:  Mod I with all adls and functional mobility with [facility]    HOME SUPPORT: The patient lived with brother but did not require assistance.    Occupational Therapy Goals:  Initiated 01/30/2023  Patient/Family stated goal: to get back home  Patient will perform grooming in standing with Modified Independence within 7 day(s).  Patient will perform UB bathing with Modified Independence within 7 day(s).  Patient will perform LB bathing with Modified Independence within 7 day(s).  Patient will perform toilet transfers with Modified Independence  within 7 day(s).  Patient will perform all aspects of toileting with Modified Independence within 7 day(s).  Patient will participate in upper extremity therapeutic exercise/activities with Independence within 7 day(s).    Patient will utilize energy conservation techniques during functional activities with verbal cues within 7 day(s).  Patient will be independent in 3  step meal preparation tasks in prep for household management tasks within 7 day(s).   Outcome: Progressing        PLAN :  Recommendations and Planned Interventions: self care  training, therapeutic activities, functional mobility training, balance training, therapeutic exercise, endurance activities, and patient education    Recommend next OT session: Toileting    Frequency/Duration: Patient will be followed by occupational therapy:  3-5x/week to address goals.    Recommendation for discharge: (in order for the patient to meet his/her long term  goals)  Home with Home Health Therapy and family assist    IF patient discharges home will need the following DME: rolling walker     SUBJECTIVE:   Patient stated "I am okay."    OBJECTIVE DATA SUMMARY:     Past Medical History:   Diagnosis Date    Chronic kidney disease, stage 3 (HCC)     Coronary artery disease involving native coronary artery of native heart without angina pectoris     CVA (cerebral vascular accident) (HCC)     Essential hypertension     Hyperlipidemia     Sequela of cerebrovascular accident     Type 2 diabetes mellitus (HCC)    History reviewed. No pertinent surgical history.     Expanded or extensive additional review of patient history:   Lives With: Family  Type of Home: House  Home Layout: Two level  Home Access: Stairs to enter with rails        Bathroom Shower/Tub: Pension scheme manager: Dance movement psychotherapist: Chief Strategy Officer: Optometrist History  Lives With: Family  Type of Home: House  Home Layout: Two level  Home Access: Stairs to enter with rails  Entrance Stairs - Number of Steps: 2  Bathroom Shower/Tub: Pension scheme manager: Dance movement psychotherapist: Buyer, retail: Medical laboratory scientific officer  ADL Assistance: Independent  Homemaking Assistance: Independent  Ambulation Assistance: Independent  Transfer Assistance: Independent    Hand Dominance: right     EXAMINATION OF PERFORMANCE DEFICITS:    Cognitive/Behavioral Status:  Orientation  Overall Orientation Status: Impaired  Orientation Level: Oriented to person;Disoriented to place;Disoriented to time  Cognition  Overall Cognitive Status: Exceptions  Following Commands: Follows one step commands consistently;Follows multistep commands with increased time;Follows multistep commands with repitition  Attention Span: Difficulty attending to directions  Memory: Decreased short term memory  Safety Judgement: Decreased awareness of need for assistance;Decreased awareness of  need for safety  Problem Solving: Decreased awareness of errors  Insights: Decreased awareness of deficits  Initiation: Requires cues for some  Sequencing: Requires cues for some    Skin: intact    Edema: none noted    Hearing:   Hearing  Hearing: Exceptions to [facility]  Hearing Exceptions: Hard of hearing/hearing concerns    Vision/Perceptual:          Vision  Vision: Within Functional Limits       Range of Motion:     AROM: Generally decreased, functional       Strength:    Strength: Generally decreased, functional    Coordination:               Tone & Sensation:     Tone: Normal  Sensation: Intact      Functional Mobility and Transfers for ADLs:  Bed Mobility:  Bed Mobility Training  Bed Mobility Training: Yes  Rolling: Modified independent  Supine to Sit: Minimum assistance;Assist X1  Sit to Supine: Minimum assistance;Assist X1    Transfers:  Transfer Training  Transfer Training: Yes  Overall Level of Assistance: Contact-guard assistance;Minimum assistance;Assist X1;Adaptive equipment  Sit to Stand: Contact-guard assistance;Assist X1;Adaptive equipment  Stand to Sit: Contact-guard assistance;Assist X1;Adaptive equipment      Balance:  Balance  Sitting: Intact  Standing: Impaired  Standing - Static: Fair  Standing - Dynamic: Fair;Poor      ADL Assessment:          Feeding: Setup                                       LE Dressing: Minimal assistance;Increased time to complete       Toileting: Contact guard assistance              Therapeutic Exercises:       Functional Measure:    [employer] AM-PACTM "6 Clicks"                                                       Daily Activity Inpatient Short Form  How much help from another person does the patient currently need... Total; A Lot A Little None   1.  Putting on and taking off regular lower body clothing?   1   2   3   4   2.  Bathing (including washing, rinsing, drying)?   1   2   3   4   3.  Toileting, which includes using toilet, bedpan or  urinal?  1   2   3   4   4.  Putting on and taking off regular upper body clothing?   1   2   3   4   5.  Taking care of personal grooming such as brushing teeth?   1   2   3   4   6.  Eating meals?   1   2   3   4    2007, Trustees of [address], under license to [employer], [place]. All rights reserved     Score: 21/24     Interpretation of Tool:  Represents clinically-significant functional categories (i.e. Activities of daily living).  Percentage of Impairment CH    0%   CI    1-19% CJ    20-39% CK    40-59% CL    60-79% CM    80-99% CN     100%   AMPAC  Score 6-24 24 23  20-22 15-19 10-14 7-9 6     Occupational Therapy Evaluation Charge Determination   History Examination Decision-Making   LOW Complexity : Brief history review  LOW Complexity: 1-3 Performance deficits relating to physical, cognitive, or psychosocial skills that result in activity limitations and/or participation restrictions MEDIUM Complexity: Patient may present with comorbidities that affect occupational performance. Minimal to moderate modifications of tasks or assist (eg. physical or verbal) with assist is necessary to enable pt to complete eval      Based on the above components, the patient evaluation is determined to be of the following complexity level: Low    Pain Rating:  0/10   Pain Intervention(s):   pain is at a level acceptable to the patient    Activity Tolerance:   Fair     After treatment patient left in no apparent distress:    Patient left in no  apparent distress in bed, Call bell within reach, Bed/ chair alarm activated, and Side rails x3, bed locked and in lowest position    COMMUNICATION/EDUCATION:   The patient's plan of care was discussed with: Physical therapist, Registered nurse, and Case management    Patient Education  Education Given To: Patient  Education Provided: Role of Therapy;Plan of Care;ADL Adaptive Strategies;Transfer Training;Energy Conservation;Fall Prevention  Strategies;Equipment  Education Method: Demonstration;Verbal  Barriers to Learning: Cognition  Education Outcome: Verbalized understanding;Continued education needed    The supervising occupational therapist and treating occupational therapist assistant have met to review this patient's progress and plan of care.    This patient's plan of care is appropriate for delegation to OTA.     PT/OT sessions occurred together for increased safety of pt and clinician.     Thank you for this referral,  Barrie Dunker, OT  Minutes: 20

## 2023-01-30 NOTE — Progress Notes (Signed)
Stroke booklet given to patient.

## 2023-01-30 NOTE — Consults (Signed)
Attempted to see patient but they were at dialysis. Neurology service will reattempt the following day.

## 2023-01-30 NOTE — Progress Notes (Signed)
Patient daughter called and wanted to know results of MRI. Dr Court Joy paged . No return call yet

## 2023-01-30 NOTE — Plan of Care (Signed)
PHYSICAL THERAPY EVALUATION  Patient: Daniel Arellano (82 y.o. male)  Date: 01/30/2023  Primary Diagnosis: Aphasia [R47.01]  Acute CVA (cerebrovascular accident) (HCC) [I63.9]  Altered mental status, unspecified altered mental status type [R41.82]  Chronic kidney disease, unspecified CKD stage [N18.9]       Precautions: Fall Risk, General Precautions                      Recommendations for nursing mobility: Encourage HEP in prep for ADLs/mobility; see handout for details and Frequent repositioning to prevent skin breakdown    In place during session: Peripheral IV and EKG/telemetry     ASSESSMENT  Pt is a 82 y.o. male admitted on 01/29/2023 for slurred speech; pt currently being treated for CVA work up . Pt semi supine  upon PT arrival, agreeable to evaluation. Pt A&O x 3.  Patient lives with brother and having some difficulty with ADLs.Patient uses cane at home    Based on the objective data described below, the patient currently presents with impaired functional mobility, impaired ability to perform high-level IADLs, impaired strength, decreased activity tolerance, impaired balance, and impaired posture. (See below for objective details and assist levels).     Overall pt tolerated session fair today with PT. Pt required cg to min A for bed mobility and transfers. Pt amb 45 feet with Rw, gt belt and min assist; demonstrates off balance during gait and required vcs for cont of task. Pt will benefit from continued skilled PT to address above deficits and return to PLOF. Potential barriers for safe discharge: . Current PT DC recommendation Home with Home Health Therapy and family assist once medically appropriate.      GOALS:    Problem: Physical Therapy - Adult  Goal: By Discharge: Performs mobility at highest level of function for planned discharge setting.  See evaluation for individualized goals.  Description: FUNCTIONAL STATUS PRIOR TO ADMISSION: Patient was modified independent using a single point cane for  functional mobility.    HOME SUPPORT PRIOR TO ADMISSION: The patient lived with brother and required contact guard assistance for gait at times.    Physical Therapy Goals  Initiated 01/30/2023  Pt stated goal: get better  Pt will be I with LE HEP in 7 days.  Pt will perform bed mobility with Stand by Assist in 7 days.  Pt will perform transfers with Stand by Assist in 7 days.   Pt will amb 100 feet with LRAD safely with Modified Independence in 7 days.  Pt will verbalize and demonstrate compliance with fall precautions  in 7 days.   Pt will demonstrate improvement in standing/gait balance from fair/good to good in 7 days.    Outcome: Progressing        PLAN :  Recommendations and Planned Interventions: bed mobility training, transfer training, gait training, therapeutic exercises, patient and family training/education, and therapeutic activities    Frequency/Duration: Patient will be followed by physical therapy:  2-3x/week to address goals.    Recommendation for discharge: (in order for the patient to meet his/her long term goals)  Home with Home Health Therapy and family assist    IF patient discharges home will need the following DME: rolling walker       SUBJECTIVE:   Patient stated "I am ok."    OBJECTIVE DATA SUMMARY:   HISTORY:    Past Medical History:   Diagnosis Date    Chronic kidney disease, stage 3 (HCC)     Coronary artery  disease involving native coronary artery of native heart without angina pectoris     CVA (cerebral vascular accident) [facility])     Essential hypertension     Hyperlipidemia     Sequela of cerebrovascular accident     Type 2 diabetes mellitus (HCC)    History reviewed. No pertinent surgical history.    Home Situation:  Social/Functional History  Lives With: Family  Type of Home: House  Home Layout: Two level  Home Access: Stairs to enter with rails  Entrance Stairs - Number of Steps: 2  Bathroom Shower/Tub: Pension scheme manager: Dance movement psychotherapist: Patent attorney: Medical laboratory scientific officer  ADL Assistance: Independent  Homemaking Assistance: Independent  Ambulation Assistance: Independent  Transfer Assistance: Independent    Cognitive/Behavioral Status:  Orientation  Overall Orientation Status: Impaired  Orientation Level: Oriented to person;Disoriented to place;Disoriented to time  Cognition  Overall Cognitive Status: Exceptions  Following Commands: Follows one step commands consistently;Follows multistep commands with increased time;Follows multistep commands with repitition  Attention Span: Difficulty attending to directions  Memory: Decreased short term memory  Safety Judgement: Decreased awareness of need for assistance;Decreased awareness of need for safety  Problem Solving: Decreased awareness of errors  Insights: Decreased awareness of deficits  Initiation: Requires cues for some  Sequencing: Requires cues for some    Skin:     Edema:     Strength:    Strength: Generally decreased, functional    Range Of Motion:  AROM: Generally decreased, functional       Coordination:        Tone & Sensation:   Tone: Normal         Functional Mobility:  Bed Mobility:     Bed Mobility Training  Bed Mobility Training: Yes  Rolling: Modified independent  Supine to Sit: Minimum assistance;Assist X1  Sit to Supine: Minimum assistance;Assist X1  Transfers:     Transfer Training  Transfer Training: Yes  Overall Level of Assistance: Contact-guard assistance;Minimum assistance;Assist X1;Adaptive equipment  Sit to Stand: Contact-guard assistance;Assist X1;Adaptive equipment  Stand to Sit: Contact-guard assistance;Assist X1;Adaptive equipment  Balance:               Balance  Sitting: Intact  Standing: Impaired  Standing - Static: Fair  Standing - Dynamic: Fair;Poor  Wheelchair Mobility:        Ambulation/Gait Training:                                Soil scientist: Yes  Overall Level of Assistance: Contact-guard assistance  Distance (ft): 45 Feet  Assistive Device: Gait belt;Walker,  rolling  Interventions: Verbal cues;Demonstration  Base of Support: Narrowed  Speed/Cadence: Slow  Gait Abnormalities: Decreased step clearance                   Therapeutic Exercises:       Functional Measure:  [employer] AM-PACT "6 Clicks"         Basic Mobility Inpatient Short Form  How much difficulty does the patient currently have... Unable A Lot A Little None   1.  Turning over in bed (including adjusting bedclothes, sheets and blankets)?    1    2    3    4   2.  Sitting down on and standing up from a chair with arms ( e.g., wheelchair, bedside commode, etc.)    1     2   [x]  3   []  4   3.  Moving from lying on back to sitting on the side of the bed?   []  1   []  2   [x]  3   []  4          How much help from another person does the patient currently need... Total A Lot A Little None   4.  Moving to and from a bed to a chair (including a wheelchair)?   []  1   []  2   [x]  3   []  4   5.  Need to walk in hospital room?   []  1   []  2   [x]  3   []  4   6.  Climbing 3-5 steps with a railing?   []  1   []  2   [x]  3   []  4    2007, Trustees of [address], under license to [place], [place]. All rights reserved     Score:  Initial: 18/24 Most Recent: X (Date: 01/30/2023 )   Interpretation of Tool:  Represents activities that are increasingly more difficult (i.e. Bed mobility, Transfers, Gait).  Score 24 23 22-20 19-15 14-10 9-7 6   Modifier CH CI CJ CK CL CM CN         Physical Therapy Evaluation Charge Determination   History Examination Presentation Decision-Making   LOW Complexity : Zero comorbidities / personal factors that will impact the outcome / POC LOW Complexity : 1-2 Standardized tests and measures addressing body structure, function, activity limitation and / or participation in recreation  LOW Complexity : Stable, uncomplicated  Other outcome measures ampac 6  LOW      Based on the above components, the patient evaluation is determined to be of the following complexity level: LOW    Pain  Rating:  0/10   Pain Intervention(s):       Activity Tolerance:   Good and Fair     After treatment patient left in no apparent distress:   Bed locked and in lowest position Patient left in no apparent distress in bed, Call bell within reach, and Bed/ chair alarm activated and nsg updated.    COMMUNICATION/EDUCATION:   The patient's plan of care was discussed with: Occupational therapist, Registered nurse, and Case management    Patient Education  Education Given To: Patient  Education Provided: Role of Therapy;Plan of Care;Fall Prevention Strategies;Home Exercise Program;Precautions;Transfer Training  Education Method: Demonstration;Verbal;Teach Back  Barriers to Learning: Cognition  Education Outcome: Verbalized understanding;Continued education needed         Thank you for this referral.  Hettie Holstein, PT  Minutes: 20

## 2023-01-30 NOTE — Progress Notes (Signed)
Patient in dialysis. 1500 neurocheck not able to be done

## 2023-01-31 LAB — HEPATITIS B SURFACE ANTIGEN
Hep B S Ag Interp: NEGATIVE
Hepatitis B Surface Ag: <0.1 Index

## 2023-01-31 LAB — CBC WITH AUTO DIFFERENTIAL
Basophils %: 0 % (ref 0–1)
Basophils Absolute: 0 10*3/uL (ref 0.0–0.1)
Eosinophils %: 1 % (ref 0–7)
Eosinophils Absolute: 0.1 10*3/uL (ref 0.0–0.4)
Hematocrit: 33.6 % — ABNORMAL LOW (ref 36.6–50.3)
Hemoglobin: 11.1 g/dL — ABNORMAL LOW (ref 12.1–17.0)
Immature Granulocytes %: 0 % (ref 0–0.5)
Immature Granulocytes Absolute: 0 10*3/uL (ref 0.00–0.04)
Lymphocytes %: 22 % (ref 12–49)
Lymphocytes Absolute: 1.1 10*3/uL (ref 0.8–3.5)
MCH: 28.8 PG (ref 26.0–34.0)
MCHC: 33 g/dL (ref 30.0–36.5)
MCV: 87.3 FL (ref 80.0–99.0)
MPV: 11.3 FL (ref 8.9–12.9)
Monocytes %: 8 % (ref 5–13)
Monocytes Absolute: 0.4 10*3/uL (ref 0.0–1.0)
Neutrophils %: 69 % (ref 32–75)
Neutrophils Absolute: 3.3 10*3/uL (ref 1.8–8.0)
Nucleated RBCs: 0 PER 100 WBC
Platelets: 165 10*3/uL (ref 150–400)
RBC: 3.85 M/uL — ABNORMAL LOW (ref 4.10–5.70)
RDW: 14.1 % (ref 11.5–14.5)
WBC: 4.8 10*3/uL (ref 4.1–11.1)
nRBC: 0 10*3/uL (ref 0.00–0.01)

## 2023-01-31 LAB — RENAL FUNCTION PANEL
Albumin: 3.5 g/dL (ref 3.5–5.0)
Anion Gap: 12 mmol/L (ref 5–15)
BUN/Creatinine Ratio: 4 — ABNORMAL LOW (ref 12–20)
BUN: 39 mg/dL — ABNORMAL HIGH (ref 6–20)
CO2: 28 mmol/L (ref 21–32)
Calcium: 9.6 mg/dL (ref 8.5–10.1)
Chloride: 93 mmol/L — ABNORMAL LOW (ref 97–108)
Creatinine: 11 mg/dL — ABNORMAL HIGH (ref 0.70–1.30)
Est, Glom Filt Rate: 4 mL/min/{1.73_m2} — ABNORMAL LOW (ref >60)
Glucose: 156 mg/dL — ABNORMAL HIGH (ref 65–100)
Phosphorus: 6.2 mg/dL — ABNORMAL HIGH (ref 2.6–4.7)
Potassium: 4 mmol/L (ref 3.5–5.1)
Sodium: 133 mmol/L — ABNORMAL LOW (ref 136–145)

## 2023-01-31 LAB — POCT GLUCOSE
POC Glucose: 124 mg/dL — ABNORMAL HIGH (ref 65–100)
POC Glucose: 182 mg/dL — ABNORMAL HIGH (ref 65–100)
POC Glucose: 258 mg/dL — ABNORMAL HIGH (ref 65–100)

## 2023-01-31 MED ORDER — POLYETHYLENE GLYCOL 3350 17 G PO PACK
17 g | PACK | Freq: Every day | ORAL | 0 refills | Status: AC | PRN
Start: 2023-01-31 — End: 2023-03-02

## 2023-01-31 MED ORDER — LIDOCAINE 4 % EX PTCH
4 | MEDICATED_PATCH | Freq: Every day | CUTANEOUS | 0 refills | Status: AC
Start: 2023-01-31 — End: ?

## 2023-01-31 MED ORDER — SEVELAMER CARBONATE 800 MG PO TABS
800 | Freq: Three times a day (TID) | ORAL | Status: DC
Start: 2023-01-31 — End: 2023-01-31
  Administered 2023-01-31: 22:00:00 1600 mg via ORAL

## 2023-01-31 MED ORDER — GLUCOSE 4 G PO CHEW
4 | ORAL_TABLET | ORAL | 3 refills | Status: AC | PRN
Start: 2023-01-31 — End: ?

## 2023-01-31 MED ORDER — ACETAMINOPHEN 325 MG PO TABS
325 | Freq: Four times a day (QID) | ORAL | Status: DC | PRN
Start: 2023-01-31 — End: 2023-01-31
  Administered 2023-01-31 (×2): 650 mg via ORAL

## 2023-01-31 MED ORDER — LIDOCAINE 4 % EX PTCH
4 | Freq: Every day | CUTANEOUS | Status: DC
Start: 2023-01-31 — End: 2023-01-31
  Administered 2023-01-31 (×2): 1 via TRANSDERMAL

## 2023-01-31 MED ORDER — INSULIN GLARGINE 100 UNIT/ML SC SOLN
100 UNIT/ML | Freq: Every evening | SUBCUTANEOUS | 3 refills | Status: AC
Start: 2023-01-31 — End: ?

## 2023-01-31 MED ORDER — ATORVASTATIN CALCIUM 80 MG PO TABS
80 | ORAL_TABLET | Freq: Every evening | ORAL | 3 refills | Status: AC
Start: 2023-01-31 — End: ?

## 2023-01-31 MED ORDER — PROPYLTHIOURACIL 50 MG PO TABS
50 MG | ORAL_TABLET | Freq: Three times a day (TID) | ORAL | 0 refills | Status: AC
Start: 2023-01-31 — End: 2023-03-02

## 2023-01-31 MED ORDER — METOPROLOL SUCCINATE ER 25 MG PO TB24
25 | ORAL_TABLET | Freq: Every day | ORAL | 3 refills | Status: AC
Start: 2023-01-31 — End: ?

## 2023-01-31 MED FILL — PROPYLTHIOURACIL 50 MG PO TABS: 50 MG | ORAL | Qty: 1

## 2023-01-31 MED FILL — SEVELAMER CARBONATE 800 MG PO TABS: 800 MG | ORAL | Qty: 2

## 2023-01-31 MED FILL — TYLENOL 325 MG PO TABS: 325 MG | ORAL | Qty: 2

## 2023-01-31 MED FILL — PEG 3350 17 G PO PACK: 17 g | ORAL | Qty: 1

## 2023-01-31 MED FILL — LIDOCAINE PAIN RELIEF 4 % EX PTCH: 4 % | CUTANEOUS | Qty: 1

## 2023-01-31 MED FILL — LANTUS 100 UNIT/ML SC SOLN: 100 UNIT/ML | SUBCUTANEOUS | Qty: 10

## 2023-01-31 MED FILL — ATORVASTATIN CALCIUM 40 MG PO TABS: 40 MG | ORAL | Qty: 2

## 2023-01-31 MED FILL — METOPROLOL SUCCINATE ER 25 MG PO TB24: 25 MG | ORAL | Qty: 1

## 2023-01-31 MED FILL — ELIQUIS 2.5 MG PO TABS: 2.5 MG | ORAL | Qty: 1

## 2023-01-31 MED FILL — ASPIRIN ADULT LOW STRENGTH 81 MG PO TBEC: 81 MG | ORAL | Qty: 1

## 2023-01-31 MED FILL — FUROSEMIDE 40 MG PO TABS: 40 MG | ORAL | Qty: 1

## 2023-01-31 NOTE — Progress Notes (Signed)
Physician Progress Note      PATIENT:               SLATE, DEBROUX  CSN #:                  [id number]  DOB:                       16-Mar-1941  ADMIT DATE:       01/29/2023 11:09 AM  DISCH DATE:        01/31/2023 6:31 PM  RESPONDING  PROVIDER #:        Blake Divine MD          QUERY TEXT:    Patient admitted with left sided weakness. Noted documentation of CVA in notes   and Discharge summary. In order to support the diagnosis of CVA, please   include additional clinical indicators in your documentation.  Or please   document if the diagnosis of *** has been ruled out after further study.    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  -- CVA was ruled out  -- Other - I will add my own diagnosis  -- Disagree - Not applicable / Not valid  -- Disagree - Clinically unable to determine / Unknown  -- Refer to Clinical Documentation Reviewer    PROVIDER RESPONSE TEXT:    CVA was ruled out after study.    Query created by: Lezlie Lye on 02/05/2023 7:38 AM      Electronically signed by:  Blake Divine MD 02/05/2023 8:33 AM

## 2023-01-31 NOTE — Plan of Care (Signed)
OCCUPATIONAL THERAPY TREATMENT  Patient: Daniel Arellano (82 y.o. male)  Date: 01/31/2023  Primary Diagnosis: Aphasia [R47.01]  Acute CVA (cerebrovascular accident) (HCC) [I63.9]  Altered mental status, unspecified altered mental status type [R41.82]  Chronic kidney disease, unspecified CKD stage [N18.9]       Precautions: Fall Risk, General Precautions                Recommendations for nursing mobility: Out of bed to chair for meals, Encourage HEP in prep for ADLs/mobility; see handout for details, Use of bed/chair alarm for safety, AD and gt belt for bed to chair , Amb to bathroom with AD and gait belt, and Assist x1    In place during session: None  Chart, occupational therapy assessment, plan of care, and goals were reviewed.  ASSESSMENT  Patient continues with skilled OT services and is progressing towards goals. Pt presented sitting in chair upon COTA arrival, agreeable to session. Pt A&O x 4. Pt declined need to use the bathroom.  Pt was fully dressed. Patient  was verbally educated on the BE FAST acronym for signs/symptoms of CVA and TIA. BE FAST was written on patient's communication board  for visual education and reinforcement.  Hand out for BE FAST and ways to prevent a stroke given to pt. All questions answered with patient indicating verbal understanding. Pt completed B UE therex one set 10 reps requiring verbal cues for technique/full ROM.  Exercises completed to improve strength to increase performance with ADLs & functional transfers. Pt also educated on energy conservation and verbalized understanding. (See below for objective details and assist levels).     Overall pt tolerated session fair today.  Potential barriers for safe discharge: pt has poor safety awareness and pt is a high fall risk. Current OT recommendations for discharge Home with Home Health Therapy and family assist. Will continue to benefit from skilled OT services, and will continue to progress as tolerated.      Start of Session End  of Session   SPO2 (%) 97 99   Heart Rate (BPM) 84 85     GOALS:    Problem: Occupational Therapy - Adult  Goal: By Discharge: Performs self-care activities at highest level of function for planned discharge setting.  See evaluation for individualized goals.  Description: FUNCTIONAL STATUS PRIOR TO ADMISSION:  Mod I with all adls and functional mobility with [facility]    HOME SUPPORT: The patient lived with brother but did not require assistance.    Occupational Therapy Goals:  Initiated 01/30/2023  Patient/Family stated goal: to get back home  Patient will perform grooming in standing with Modified Independence within 7 day(s).  Patient will perform UB bathing with Modified Independence within 7 day(s).  Patient will perform LB bathing with Modified Independence within 7 day(s).  Patient will perform toilet transfers with Modified Independence  within 7 day(s).  Patient will perform all aspects of toileting with Modified Independence within 7 day(s).  Patient will participate in upper extremity therapeutic exercise/activities with Independence within 7 day(s).    Patient will utilize energy conservation techniques during functional activities with verbal cues within 7 day(s).  Patient will be independent in 3  step meal preparation tasks in prep for household management tasks within 7 day(s).   Outcome: Progressing        PLAN :  Patient continues to benefit from skilled intervention to address functional impairments. Continue treatment per established plan of care to address goals.    Recommend next OT  session: LB dressing    Recommendation for discharge: (in order for the patient to meet his/her long term goals): Home with Home Health Therapy and family assist    IF patient discharges home will need the following DME: continuing to assess with progress     SUBJECTIVE:   Patient stated "Thank you."      OBJECTIVE DATA SUMMARY:   Cognitive/Behavioral Status:  Orientation  Orientation Level: Oriented X4  Cognition  Overall  Cognitive Status: Exceptions  Following Commands: Follows one step commands consistently;Follows multistep commands with increased time;Follows multistep commands with repitition  Attention Span: Attends with cues to redirect  Initiation: Does not require cues  Sequencing: Does not require cues    Therapeutic exercise:  Completed while sitting in chair  Exercise Sets Reps AROM AAROM PROM Self PROM Comments   Shoulder flex/ext 1 10 [x]  []  []  []     Elbow flex/ext 1 10 [x]  []  []  []     Wrist flex/ext 0 10 [x]  []  []  []        []  []  []  []       Pain Rating:  0/10   Pain Intervention(s):   pain is at a level acceptable to the patient    Activity Tolerance:   Fair  and requires rest breaks    After treatment patient left in no apparent distress:   Bed locked and returned to lowest position, Patient left in no apparent distress sitting up in chair, Call bell within reach, and Updated patient's board on functional status and mobility recommendations, and nsg updated     COMMUNICATION/EDUCATION:   The patient's plan of care was discussed with: Registered nurse    Patient Education  Education Given To: Patient  Education Provided: Plan of Care;Home Exercise Program  Education Provided Comments: BE FAST  Education Method: Verbal;Demonstration;Printed Information/Hand-outs  Barriers to Learning: Cognition  Education Outcome: Verbalized understanding;Demonstrated understanding;Continued education needed    Thank you for this referral.  Anayelli Lai, OTA  Minutes: 14

## 2023-01-31 NOTE — Progress Notes (Signed)
..  4 Eyes Skin Assessment     NAME:  Camden Knotek  DATE OF BIRTH:  [birth year]  MEDICAL RECORD NUMBER:  [id number]    The patient is being assessed for  Other Per unit protocol    I agree that at least one RN has performed a thorough Head to Toe Skin Assessment on the patient. ALL assessment sites listed below have been assessed.      Areas assessed by both nurses:    Head, Face, Ears, Shoulders, Back, Chest, Arms, Elbows, Hands, Sacrum. Buttock, Coccyx, Ischium, and Legs. Feet and Heels        Does the Patient have a Wound? No noted wound(s)       Braden Prevention initiated by RN: Yes  Wound Care Orders initiated by RN: No    Pressure Injury (Stage 3,4, Unstageable, DTI, NWPT, and Complex wounds) if present, place Wound referral order by RN under ORDER ENTRY: No    New Ostomies, if present place, Ostomy referral order under ORDER ENTRY: No     Nurse 1 eSignature: Electronically signed by Sharolyn Douglas, RN on 01/31/23 at 10:07 AM EDT    **SHARE this note so that the co-signing nurse can place an eSignature**    Nurse 2 eSignature: Electronically signed by Lexine Baton, RN on 01/31/23 at 11:41 AM EDT

## 2023-01-31 NOTE — Care Coordination-Inpatient (Addendum)
DCP: from home with brother, CM to meet with pt to discuss dispo    1058: CM spoke with pt's daughter to discuss dispo with recs for home with [facility] and 24/7 care. Daughter is visiting from [place], and reports pt likely not to have 24/7 care in the home that he resides. Daughter reports pt is living in home with others, but they may not be able to provide 24/7 care. CM explained a dc order is in for pt, but would need to know confirmed dc plan to determine safety. CM to meet with dtr when she comes visit pt later this afternoon.     1315: CM spoke with pt dtr and she provided CM with the family members pt is staying with and they will transport him home-Arnold and Kerin Salen [phone]). Pt and dtr have no preference for [facility], referrals sent, awaiting accepting [facility] agency.     1415: CM attempted to call Mr. & Mrs. Chilton Si, no answer. CM left vm, awaiting return call.     1513: CM attempted to call Mr. & Mrs. Chilton Si, no answer. CM left vm, awaiting return call.       Transition of Care Plan:    RUR: 18%  Prior Level of Functioning: independent  Disposition: home with family and HH  If SNF or IPR: Date FOC offered: n/a  Date FOC received: n/a  Accepting facility: n/a  Date authorization started with reference number: n/a  Date authorization received and expires: n/a  Follow up appointments: unit secretary to setup as needed  DME needed: none  Transportation at discharge: family  IM/IMM Medicare/Tricare letter given: yes, Ist IMM given 01/30/23  Is patient a Veteran and connected with VA? yes  If yes, was [employer] transfer form completed and VA notified? yes  Caregiver Contact: daughter Ursula Alert  Discharge Caregiver contacted prior to discharge? yes  Care Conference needed? no  Barriers to discharge:  none

## 2023-01-31 NOTE — Progress Notes (Signed)
..  Discharge plan of care/case management plan validated with provider discharge order.     Writer spoke to Mr and Mrs Daniel Arellano and informed them patient is discharged.   Patient informed about discharge instructions and he verbalized understanding the instruction given

## 2023-01-31 NOTE — Progress Notes (Signed)
Physician Progress Note      PATIENT:               Daniel Arellano, Daniel Arellano  CSN #:                  [id number]  DOB:                       [birth year]  ADMIT DATE:       01/29/2023 11:09 AM  DISCH DATE:        01/31/2023 6:31 PM  RESPONDING  PROVIDER #:        Blake Divine MD          QUERY TEXT:    Patient admitted with CVA, noted to have atrial fibrillation and is maintained   on Eliquis. 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, HTN, CVA  Clinical Indicators: EKG: Sinus tachycardia with 1st degree A-V block with   occasional Premature  ventricular complexes and Fusion complexes  Treatment: Eliquis    Thank you,  Lezlie Lye, RN, CCDS  Options provided:  -- Secondary hypercoagulable state in a patient with atrial fibrillation  -- Other - I will add my own diagnosis  -- Disagree - Not applicable / Not valid  -- Disagree - Clinically unable to determine / Unknown  -- Refer to Clinical Documentation Reviewer    PROVIDER RESPONSE TEXT:    This patient has secondary hypercoagulable state in a patient with atrial   fibrillation.    Query created by: Lezlie Lye on 01/31/2023 10:29 AM      Electronically signed by:  Blake Divine MD 02/02/2023 9:38 AM

## 2023-01-31 NOTE — Progress Notes (Signed)
Renal Progress Note    Patient: Daniel Arellano MRN: [id number]  SSN: [id number]    Date of Birth: [birth year]  Age: 82 y.o.  Sex: male      Admit Date: 01/29/2023    LOS: 2 days     Subjective:   Patient seen at bedside. Alert and awake, no acute distress. Better oriented  No complaints of any swelling in lower extremities.   No complaints of shortness of breath.         Current Facility-Administered Medications   Medication Dose Route Frequency    metoprolol succinate (TOPROL XL) extended release tablet 25 mg  25 mg Oral Daily    acetaminophen (TYLENOL) tablet 650 mg  650 mg Oral Q6H PRN    lidocaine 4 % external patch 1 patch  1 patch TransDERmal Daily    apixaban (ELIQUIS) tablet 2.5 mg  2.5 mg Oral BID    aspirin EC tablet 81 mg  81 mg Oral Daily    furosemide (LASIX) tablet 40 mg  40 mg Oral Daily    insulin glargine (LANTUS) injection vial 10 Units  10 Units SubCUTAneous Nightly    [Held by provider] lisinopril (PRINIVIL;ZESTRIL) tablet 10 mg  10 mg Oral Daily    sodium chloride flush 0.9 % injection 5-40 mL  5-40 mL IntraVENous 2 times per day    sodium chloride flush 0.9 % injection 5-40 mL  5-40 mL IntraVENous PRN    0.9 % sodium chloride infusion   IntraVENous PRN    ondansetron (ZOFRAN-ODT) disintegrating tablet 4 mg  4 mg Oral Q8H PRN    Or    ondansetron (ZOFRAN) injection 4 mg  4 mg IntraVENous Q6H PRN    polyethylene glycol (GLYCOLAX) packet 17 g  17 g Oral Daily PRN    atorvastatin (LIPITOR) tablet 80 mg  80 mg Oral Nightly    glucose chewable tablet 16 g  4 tablet Oral PRN    dextrose bolus 10% 125 mL  125 mL IntraVENous PRN    Or    dextrose bolus 10% 250 mL  250 mL IntraVENous PRN    glucagon injection 1 mg  1 mg SubCUTAneous PRN    dextrose 10 % infusion   IntraVENous Continuous PRN    propylthiouracil (PTU) tablet 50 mg  50 mg Oral 3 times per day        Vitals:    01/31/23 0658 01/31/23 1056 01/31/23 1501 01/31/23 1546   BP: 120/64 106/61 (!) 96/54 106/61   Pulse: 67 78 83 78   Resp: Temp: 97.9 F (36.6 C) 98.4 F (36.9 C) 98.4 F (36.9 C) 98.4 F (36.9 C)   TempSrc: Oral Oral Oral Oral   SpO2: 100% 98% 98% 98%   Weight:       Height:         Objective:   General: alert awake , no acute distress.  HEENT: EOMI, no Icterus, no Pallor,  mucosa moist.  Neck: Neck is supple, No JVD  Lungs: breathsounds normal, no respiratory distress on inspection,    CVS: heart sounds normal, regular rate and rhythm, no murmurs, no rubs.   GI: soft, nontender, normal BS.  Extremeties: no cyanosis, no edema,   Neuro: Alert, awake, oriented x3,  moving all extremeties well.   Skin: normal skin turgor, no skin rashes.        Intake and Output:  Current Shift: No intake/output data recorded.  Last three  shifts: 04/15 1901 - 04/17 0700  In: 1000 [P.O.:400]  Out: 2600       Lab/Data Review:  Recent Labs     01/29/23  1123 01/30/23  0758 01/31/23  1027   WBC 5.2 4.9 4.8   HGB 11.8* 11.3* 11.1*   HCT 35.0* 33.7* 33.6*   PLT 151 154 165     Recent Labs     01/29/23  1123 01/31/23  1027   NA 136 133*   K 4.4 4.0   CL 99 93*   CO2 22 28   GLUCOSE 144* 156*   BUN 59* 39*   CREATININE 13.90* 11.00*   CALCIUM 10.3* 9.6   PHOS  --  6.2*   BILITOT 0.6  --    AST 28  --    ALT 13  --      No results for input(s): "PHART", "PCO2ART", "PO2ART", "HCO3ART", "BEART", "TCO2ARRT", "HGBART", "PO2CORART", "FIO2A", "O2SATART", "OXYHEM", "CARBOXHGBART", "METHGBART", "O2CONTART", "PHCORART", "TEMP" in the last 72 hours.    Invalid input(s): "PCO2COART"  Recent Results (from the past 24 hour(s))   POCT Glucose    Collection Time: 01/30/23  4:57 PM   Result Value Ref Range    POC Glucose 104 (H) 65 - 100 mg/dL    Performed by: Charlott Rakes    Hepatitis B Surface Antigen    Collection Time: 01/30/23  5:05 PM   Result Value Ref Range    Hepatitis B Surface Ag <0.10 Index    Hep B S Ag Interp Negative Negative     POCT Glucose    Collection Time: 01/30/23  7:41 PM   Result Value Ref Range    POC Glucose 258 (H) 65 - 100 mg/dL    Performed by:  Melvyn Novas    POCT Glucose    Collection Time: 01/31/23  7:49 AM   Result Value Ref Range    POC Glucose 124 (H) 65 - 100 mg/dL    Performed by: Charlott Rakes    CBC with Auto Differential    Collection Time: 01/31/23 10:27 AM   Result Value Ref Range    WBC 4.8 4.1 - 11.1 K/uL    RBC 3.85 (L) 4.10 - 5.70 M/uL    Hemoglobin 11.1 (L) 12.1 - 17.0 g/dL    Hematocrit 09.8 (L) 36.6 - 50.3 %    MCV 87.3 80.0 - 99.0 FL    MCH 28.8 26.0 - 34.0 PG    MCHC 33.0 30.0 - 36.5 g/dL    RDW 11.9 14.7 - 82.9 %    Platelets 165 150 - 400 K/uL    MPV 11.3 8.9 - 12.9 FL    Nucleated RBCs 0.0 0.0 PER 100 WBC    nRBC 0.00 0.00 - 0.01 K/uL    Neutrophils % 69 32 - 75 %    Lymphocytes % 22 12 - 49 %    Monocytes % 8 5 - 13 %    Eosinophils % 1 0 - 7 %    Basophils % 0 0 - 1 %    Immature Granulocytes % 0 0 - 0.5 %    Neutrophils Absolute 3.3 1.8 - 8.0 K/UL    Lymphocytes Absolute 1.1 0.8 - 3.5 K/UL    Monocytes Absolute 0.4 0.0 - 1.0 K/UL    Eosinophils Absolute 0.1 0.0 - 0.4 K/UL    Basophils Absolute 0.0 0.0 - 0.1 K/UL    Immature Granulocytes Absolute 0.0 0.00 - 0.04 K/UL  Differential Type AUTOMATED     Renal Function Panel    Collection Time: 01/31/23 10:27 AM   Result Value Ref Range    Sodium 133 (L) 136 - 145 mmol/L    Potassium 4.0 3.5 - 5.1 mmol/L    Chloride 93 (L) 97 - 108 mmol/L    CO2 28 21 - 32 mmol/L    Anion Gap 12 5 - 15 mmol/L    Glucose 156 (H) 65 - 100 mg/dL    BUN 39 (H) 6 - 20 mg/dL    Creatinine 16.10 (H) 0.70 - 1.30 mg/dL    Bun/Cre Ratio 4 (L) 12 - 20      Est, Glom Filt Rate 4 (L) >60 ml/min/1.43m2    Calcium 9.6 8.5 - 10.1 mg/dL    Phosphorus 6.2 (H) 2.6 - 4.7 mg/dL    Albumin 3.5 3.5 - 5.0 g/dL   POCT Glucose    Collection Time: 01/31/23  4:24 PM   Result Value Ref Range    POC Glucose 182 (H) 65 - 100 mg/dL    Performed by: Charlott Rakes         Assessment and Plan:     End-stage renal disease on hemodialysis: on MWF at [facility]  -No complaints of any fluid overload,   -Stable electrolytes,  hemodynamically stable  Status post hemodialysis yesterday  -Inpatient hemodialysis arranged for tomorrow   -We will continue maintenance hemodialysis on MWF     2. Hypertension: Borderline hypotension present  Discontinue Lasix and lisinopril  -Patient on a small dose of metoprolol-  continue to monitor blood pressures  -Advised salt restricted diet     3. Anemia: Stable hemoglobin  -Continue to monitor H&H     4. Renal osteodystrophy:   -Stable calcium,  Hyperphosphatemia with phosphorus of 6.2, will add Renvela 800 mg 2 tablets p.o. 3 times daily with meals  -Secondary hyperparathyroidism: Continue as per outpatient protocol     5.. Diabetes mellitus: controlled  -Accuchecks and sliding scale, management per primary     7. Altered mental status: Etiology unclear, ?  Missed hemodialysis  Improved mental status back to baseline with hemodialysis yesterday  Continue to monitor     7. Elevated troponin/A-fib  -Cardiology following  -On Eliquis    Signed By: Florestine Avers, MD     January 31, 2023

## 2023-01-31 NOTE — Progress Notes (Signed)
Writer attempted to call Mr and Mrs Chilton Si but no answer

## 2023-01-31 NOTE — Plan of Care (Signed)
PHYSICAL THERAPY TREATMENT     Patient: Daniel Arellano (82 y.o. male)  Date: 01/31/2023  Diagnosis: Aphasia [R47.01]  Acute CVA (cerebrovascular accident) (HCC) [I63.9]  Altered mental status, unspecified altered mental status type [R41.82]  Chronic kidney disease, unspecified CKD stage [N18.9] Aphasia      Precautions: Fall Risk, General Precautions                      Recommendations for nursing mobility: Out of bed to chair for meals, Encourage HEP in prep for ADLs/mobility; see handout for details, Frequent repositioning to prevent skin breakdown, and AD and gt belt for bed to chair     In place during session: EKG/telemetry   Chart, physical therapy assessment, plan of care and goals were reviewed.  ASSESSMENT  Patient continues with skilled PT services and is progressing towards goals. Pt sitting up in the bed upon PT arrival, agreeable to session. Pt A&O x 4. (See below for objective details and assist levels).     Overall pt tolerated session fair today with decreased gait, more impaired balance with OOB.  Pt was able to complete bed mob and transfers well however unsteady gait noted and reported L low back/hip pain limiting gait. Pt only amb approx 5 ft fwd/back with RW due to pain. Educated on LE therex to improve strength and flexibility with low back ROM. Will continue to benefit from skilled PT services, and will continue to progress as tolerated. Potential barriers for safe discharge: pt is a high fall risk, pt is not safe to be alone, and concern for pt safely navigating or managing the home environment. Current PT DC recommendation Home with Home Health Therapy and family assist once medically appropriate.      GOALS:    Problem: Physical Therapy - Adult  Goal: By Discharge: Performs mobility at highest level of function for planned discharge setting.  See evaluation for individualized goals.  Description: FUNCTIONAL STATUS PRIOR TO ADMISSION: Patient was modified independent using a single  point cane for functional mobility.    HOME SUPPORT PRIOR TO ADMISSION: The patient lived with brother and required contact guard assistance for gait at times.    Physical Therapy Goals  Initiated 01/30/2023  Pt stated goal: get better  Pt will be I with LE HEP in 7 days.  Pt will perform bed mobility with Stand by Assist in 7 days.  Pt will perform transfers with Stand by Assist in 7 days.   Pt will amb 100 feet with LRAD safely with Modified Independence in 7 days.  Pt will verbalize and demonstrate compliance with fall precautions  in 7 days.   Pt will demonstrate improvement in standing/gait balance from fair/good to good in 7 days.    Outcome: Progressing          PLAN :  Patient continues to benefit from skilled intervention to address functional impairments. Continue treatment per established plan of care to address goals.    Recommendation for discharge: (in order for the patient to meet his/her long term goals)  Skilled Nursing Facility    IF patient discharges home will need the following XWR:[id number] to assess with progress     SUBJECTIVE:   Patient stated "my left hip hurts"    OBJECTIVE DATA SUMMARY:   Critical Behavior:  Orientation  Orientation Level: Oriented X4  Cognition  Overall Cognitive Status: Exceptions  Following Commands: Follows one step commands consistently;Follows multistep commands with increased time;Follows multistep commands with  repitition  Attention Span: Attends with cues to redirect  Initiation: Does not require cues  Sequencing: Does not require cues    Functional Mobility Training:  Bed Mobility:  Bed Mobility Training  Rolling: Supervision  Supine to Sit: Supervision  Transfers:  Transfer Training  Transfer Training: Yes  Sit to Stand: Contact-guard assistance;Assist X1;Adaptive equipment  Stand to Sit: Contact-guard assistance;Assist X1;Adaptive equipment  Stand Pivot Transfers: Minimum assistance  Bed to Chair: Minimum assistance    Balance:  Balance  Sitting:  Intact  Standing: Impaired  Standing - Static: Fair  Standing - Dynamic: Fair    Ambulation/Gait Training:  Gait  Gait Training: Yes  Overall Level of Assistance: Minimum assistance  Distance (ft): 5 Feet  Assistive Device: Gait belt;Walker, rolling  Interventions: Verbal cues;Demonstration  Base of Support: Narrowed  Speed/Cadence: Slow  Gait Abnormalities: Decreased step clearance    Therapeutic Exercises:     EXERCISE   Sets   Reps   Active Active Assist   Passive Self ROM   Comments   Ankle Pumps  15        Quad Sets/Glut Sets          Hamstring Sets          Short Arc Quads          Heel Slides          Straight Leg Raises          Hip abd/add          Long Arc Quads  10        Marching  10        Seated HR/TR                       Pain Rating:  /10  reported  Pain Intervention(s):   nursing notified    Activity Tolerance:   Fair  and requires rest breaks    After treatment patient left in no apparent distress:   Bed locked and returned to lowest position, Patient left in no apparent distress sitting up in chair and Call bell within reach, and nsg updated     COMMUNICATION/COLLABORATION:   The patient's plan of care was discussed with: Registered nurse    Patient Education  Education Given To: Patient  Education Provided: Role of Therapy;Plan of Care;Fall Prevention Strategies;Home Exercise Program;Precautions;Transfer Training  Education Method: Demonstration;Verbal;Teach Back  Barriers to Learning: Cognition  Education Outcome: Verbalized understanding;Continued education needed    Debbra Riding, PTA  Minutes: 16

## 2023-01-31 NOTE — Discharge Summary (Signed)
Discharge Summary    Daniel Arellano  DOB:  03-12-1941  MRN:  [id number]    ADMIT DATE:  01/29/2023  DISCHARGE DATE:  01/31/2023    PRIMARY CARE PHYSICIAN:  Bankuru, Donne Anon, MD    VISIT STATUS: Admission    CODE STATUS:  Full Code    DISCHARGE DIAGNOSES:  Principal Problem:    Aphasia  Active Problems:    Acute CVA (cerebrovascular accident) (HCC)  Resolved Problems:    * No resolved hospital problems. 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 Coralee Pesa  RECOMMENDED NEXT STEPS:        DISCHARGE MEDICATIONS:         Medication List        START taking these medications      glucose 4 g chewable tablet  Take 4 tablets by mouth as needed for Low blood sugar     insulin glargine 100 UNIT/ML injection vial  Commonly known as: LANTUS  Inject 10 Units into the skin nightly  Replaces: insulin detemir 100 UNIT/ML injection vial     lidocaine 4 % external patch  Place 1 patch onto the skin daily  Start taking on: February 01, 2023     polyethylene glycol 17 g packet  Commonly known as: GLYCOLAX  Take 1 packet by mouth daily as needed for Constipation     propylthiouracil 50 MG tablet  Commonly known as: PTU  Take 1 tablet by mouth every 8 hours            CHANGE how you take these medications      atorvastatin 80 MG tablet  Commonly  known as: LIPITOR  Take 1 tablet by mouth nightly  What changed:   medication strength  how much to take  when to take this     metoprolol succinate 25 MG extended release tablet  Commonly known as: TOPROL XL  Take 1 tablet by mouth daily  Start taking on: February 01, 2023  What changed:   medication strength  how much to take            CONTINUE taking these medications      apixaban 2.5 MG Tabs tablet  Commonly known as: ELIQUIS     aspirin 81 MG EC tablet     furosemide 40 MG tablet  Commonly known as: LASIX            STOP taking these medications      insulin detemir 100 UNIT/ML injection vial  Commonly known as: LEVEMIR  Replaced by: insulin glargine 100 UNIT/ML injection vial     lisinopril 10 MG tablet  Commonly known as: PRINIVIL;ZESTRIL               Where to Get Your Medications        These medications were sent to [id number] - [place],  [place] - [address] [phone] Carmon Ginsberg [phone]  [address], [place] [address]      Phone: [phone]   atorvastatin 80 MG tablet  glucose 4 g chewable tablet  insulin glargine 100 UNIT/ML injection vial  lidocaine 4 % external patch  metoprolol succinate 25 MG extended release tablet  polyethylene glycol 17 g packet  propylthiouracil 50 MG tablet         DIET: ADULT DIET; Easy to Chew; 3 carb choices (45 gm/meal); Low Fat/Low Chol/High Fiber/NAS; Low Sodium (2 gm)    ACTIVITY: up with assist  ______________________________________________________________________  COMPLEXITY OF FOLLOW UP:    Moderate Complexity: follow up within 7-14 calendar days (52841)    Severe Complexity: follow up within 7 calendar days (32440)    FOLLOW UP TESTING, PENDING RESULTS OR REFERRALS AT TRANSITIONAL CARE VISIT:     Yes      No    PENDING STUDIES:       DISPOSITION: Home    FACILITY/HOME CARE AGENCY NAME:     Follow up with Bankuru, Donne Anon, MD  [address]  [place]  [place] [address]  [phone]    Call in 1 week(s)      Daniel Divine, MD  [address] [place] [address]  [phone]    Call in 1 week(s)     on     INSTRUCTIONS TO MA/SW: Please call patient on day after discharge (must document patient  contacted within 2 business days of discharge).    FOLLOW UP QUESTIONS FOR MA/SW:  1. Did you get medications filled and taking them as instructed from discharge?  2. Are you following your discharge instructions from your hospital stay?  3. Please confirm patient is scheduled for a follow up appointment within the above time frame.    DISCHARGE TIME: > 30 minutes    SIGNED:  Delma Officer, MD   01/31/2023, 9:43 AM

## 2023-01-31 NOTE — Progress Notes (Signed)
[facility]  [address] [place], [place] [address]  Phone: [phone]      Progress Note      01/31/2023 11:29 AM  NAME: Daniel Arellano   MRN:  [id number]   Admit Diagnosis: Aphasia [R47.01]  Acute CVA (cerebrovascular accident) (HCC) [I63.9]  Altered mental status, unspecified altered mental status type [R41.82]  Chronic kidney disease, unspecified CKD stage [N18.9]          Assessment/Plan:     Elevated troponin which in the absence of anginal symptoms appears to be type II non-STEMI with poor renal clearance.  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  Continue aspirin, high-dose statin and beta-blocker for myocardial protection.  Paroxysmal atrial fibrillation.  Patient currently in sinus rhythm.  Continue rate control with anticoagulation by Eliquis.  History of CVA with left-sided weakness.  ESRD with hemodialysis dependency.  No further cardiac workup at this juncture is recommended.  Will follow patient as needed.         []        High complexity decision making was performed in this patient at high risk for decompensation with multiple organ involvement.    Subjective:     Amor Packard denies chest pain, dyspnea.  Discussed with RN events overnight.     Review of Systems:     []          Unable to obtain  ROS due to ---   [x]          Total of 12 systems reviewed as follows:     Constitutional: negative fever, negative chills, negative weight loss  Eyes:               negative visual changes  ENT:                negative sore throat, tongue or lip swelling  Respiratory:     negative cough, negative dyspnea  Cards:             As per HPI  GI:                   negative for nausea, vomiting, diarrhea, and abdominal pain  Genitourinary: negative for frequency, dysuria  Integument:     negative for rash   Hematologic:   negative for easy bruising and gum/nose bleeding  Musculoskel:   negative for myalgias,   back pain  Neurological:   negative for headaches, dizziness, vertigo, weakness  Behavl/Psych: negative for feelings of anxiety, depression     Objective:      Physical Exam:    Last 24hrs VS reviewed since prior progress note. Most recent are:    BP 106/61   Pulse 78   Temp 98.4 F (36.9 C) (Oral)   Resp 16   Ht 1.778 m (5\' 10" )   Wt 76.7 kg (169 lb 1.5 oz)   SpO2 98%   BMI 24.26 kg/m     Intake/Output Summary (Last 24 hours) at 01/31/2023 1129  Last data filed at 01/30/2023 2130  Gross per 24 hour   Intake 1000 ml   Output 2600 ml   Net -1600 ml        Physical Exam:  Constitutional: Well developed well-nourished patient who appeared in no acute distress  HEENT: Normocephalic and atraumatic.  Extraocular movement intact.  Pupil reactive to light bilaterally  Neck: Supple without thyromegaly  Lungs: Scattered rhonchi diffusely  Heart:  Regular rhythm, normal S1-S2.  Jugular venous distention absent.  Carotid bruits absent.  PMI in fifth intercostal space on left midclavicular line.  Extremities  Trace edema bilaterally  Abdomen: Soft nontender nondistended hypoactive bowel sounds  Skin: Dry and warm    []          Post-cath site without hematoma, bruit, tenderness, or thrill.  Distal pulses intact.    PMH/SH reviewed - no change compared to H&P    Data Review    Telemetry: normal sinus rhythm     Last EKG    Encounter Date: 01/29/23   EKG 12 Lead   Result Value    Ventricular Rate 102    Atrial Rate 102    P-R Interval 214    QRS Duration 84    Q-T Interval 346    QTc Calculation (Bazett) 450    P Axis 34    R Axis -25    T Axis 62    Diagnosis      Sinus tachycardia with 1st degree A-V block with occasional Premature   ventricular complexes and Fusion complexes  Anterior infarct , age undetermined  Abnormal ECG  When compared with ECG of 03-Jun-2021 09:20,  PR interval has increased  Anterior infarct is now Present  Nonspecific T wave abnormality no longer evident in Inferior leads  Confirmed by Jafferani,  Asif ([id number]) on 01/29/2023 10:11:29 PM          CT LUMBAR SPINE W CONTRAST   Final Result   1.  Multilevel spondylitic changes with L4-L5 grade 1 anterolisthesis and mild   dextroscoliosis. Multilevel spinal canal stenosis, most severe at L4-L5.   Multilevel foraminal stenosis, most severe at L3-L4 on the right.   2.  Partially visualized large hiatal hernia, correlate with symptomatology.   3.  Nonobstructing left nephrolithiasis.   4.  Findings which can be seen with cystitis.   5.  Additional incidental findings as outlined above.               MRI brain without contrast   Final Result   No acute process. Chronic white matter disease and areas of prior infarction as   above.         CT HEAD WO CONTRAST   Final Result      No evidence for acute intracranial abnormality                    Recent Results (from the past 24 hour(s))   POCT Glucose    Collection Time: 01/30/23  4:57 PM   Result Value Ref Range    POC Glucose 104 (H) 65 - 100 mg/dL    Performed by: Charlott Rakes    Hepatitis B Surface Antigen    Collection Time: 01/30/23  5:05 PM   Result Value Ref Range    Hepatitis B Surface Ag <0.10 Index    Hep B S Ag Interp Negative Negative     POCT Glucose    Collection Time: 01/30/23  7:41 PM   Result Value Ref Range    POC Glucose 258 (H) 65 - 100 mg/dL    Performed by: Melvyn Novas    POCT Glucose    Collection Time: 01/31/23  7:49 AM   Result Value Ref Range    POC Glucose 124 (H) 65 - 100 mg/dL    Performed by: Charlott Rakes    CBC with Auto Differential    Collection Time: 01/31/23 10:27 AM  Result Value Ref Range    WBC 4.8 4.1 - 11.1 K/uL    RBC 3.85 (L) 4.10 - 5.70 M/uL    Hemoglobin 11.1 (L) 12.1 - 17.0 g/dL    Hematocrit 54.0 (L) 36.6 - 50.3 %    MCV 87.3 80.0 - 99.0 FL    MCH 28.8 26.0 - 34.0 PG    MCHC 33.0 30.0 - 36.5 g/dL    RDW 98.1 19.1 - 47.8 %    Platelets 165 150 - 400 K/uL    MPV 11.3 8.9 - 12.9 FL    Nucleated RBCs 0.0 0.0 PER 100 WBC    nRBC 0.00 0.00 - 0.01 K/uL    Neutrophils % 69 32 - 75  %    Lymphocytes % 22 12 - 49 %    Monocytes % 8 5 - 13 %    Eosinophils % 1 0 - 7 %    Basophils % 0 0 - 1 %    Immature Granulocytes % 0 0 - 0.5 %    Neutrophils Absolute 3.3 1.8 - 8.0 K/UL    Lymphocytes Absolute 1.1 0.8 - 3.5 K/UL    Monocytes Absolute 0.4 0.0 - 1.0 K/UL    Eosinophils Absolute 0.1 0.0 - 0.4 K/UL    Basophils Absolute 0.0 0.0 - 0.1 K/UL    Immature Granulocytes Absolute 0.0 0.00 - 0.04 K/UL    Differential Type AUTOMATED     Renal Function Panel    Collection Time: 01/31/23 10:27 AM   Result Value Ref Range    Sodium 133 (L) 136 - 145 mmol/L    Potassium 4.0 3.5 - 5.1 mmol/L    Chloride 93 (L) 97 - 108 mmol/L    CO2 28 21 - 32 mmol/L    Anion Gap 12 5 - 15 mmol/L    Glucose 156 (H) 65 - 100 mg/dL    BUN 39 (H) 6 - 20 mg/dL    Creatinine 29.56 (H) 0.70 - 1.30 mg/dL    Bun/Cre Ratio 4 (L) 12 - 20      Est, Glom Filt Rate 4 (L) >60 ml/min/1.56m2    Calcium 9.6 8.5 - 10.1 mg/dL    Phosphorus 6.2 (H) 2.6 - 4.7 mg/dL    Albumin 3.5 3.5 - 5.0 g/dL          Cardiac cath:    No results found for this or any previous visit.       Echo:     No results found for this or any previous visit.       Patient's  EKG, laboratory data and echocardiogram were individually reviewed by me.      Lab Data:    Recent Labs     01/30/23  0758 01/31/23  1027   WBC 4.9 4.8   HGB 11.3* 11.1*   HCT 33.7* 33.6*   PLT 154 165     No results for input(s): "INR", "APTT" in the last 72 hours.    Invalid input(s): "PTP"   Recent Labs     01/29/23  1123 01/31/23  1027   NA 136 133*   K 4.4 4.0   CL 99 93*   CO2 22 28   BUN 59* 39*     No results for input(s): "CPK" in the last 72 hours.    Invalid input(s): "CPKMB", "CKNDX", "TROIQ"  Lab Results   Component Value Date/Time    CHOL 134 01/30/2023 07:58 AM    HDL  26 01/30/2023 07:58 AM       Recent Labs     01/29/23  1123   GLOB 3.7     No results for input(s): "PH", "PCO2", "PO2" in the last 72 hours.    Medications Personally Reviewed:    Current Facility-Administered Medications    Medication Dose Route Frequency    metoprolol succinate (TOPROL XL) extended release tablet 25 mg  25 mg Oral Daily    acetaminophen (TYLENOL) tablet 650 mg  650 mg Oral Q6H PRN    lidocaine 4 % external patch 1 patch  1 patch TransDERmal Daily    apixaban (ELIQUIS) tablet 2.5 mg  2.5 mg Oral BID    aspirin EC tablet 81 mg  81 mg Oral Daily    furosemide (LASIX) tablet 40 mg  40 mg Oral Daily    insulin glargine (LANTUS) injection vial 10 Units  10 Units SubCUTAneous Nightly    [Held by provider] lisinopril (PRINIVIL;ZESTRIL) tablet 10 mg  10 mg Oral Daily    sodium chloride flush 0.9 % injection 5-40 mL  5-40 mL IntraVENous 2 times per day    sodium chloride flush 0.9 % injection 5-40 mL  5-40 mL IntraVENous PRN    0.9 % sodium chloride infusion   IntraVENous PRN    ondansetron (ZOFRAN-ODT) disintegrating tablet 4 mg  4 mg Oral Q8H PRN    Or    ondansetron (ZOFRAN) injection 4 mg  4 mg IntraVENous Q6H PRN    polyethylene glycol (GLYCOLAX) packet 17 g  17 g Oral Daily PRN    atorvastatin (LIPITOR) tablet 80 mg  80 mg Oral Nightly    glucose chewable tablet 16 g  4 tablet Oral PRN    dextrose bolus 10% 125 mL  125 mL IntraVENous PRN    Or    dextrose bolus 10% 250 mL  250 mL IntraVENous PRN    glucagon injection 1 mg  1 mg SubCUTAneous PRN    dextrose 10 % infusion   IntraVENous Continuous PRN    propylthiouracil (PTU) tablet 50 mg  50 mg Oral 3 times per day         Prior to Admission medications    Medication Sig Start Date End Date Taking? Authorizing Provider   insulin glargine (LANTUS) 100 UNIT/ML injection vial Inject 10 Units into the skin nightly 01/31/23  Yes Delma Officer I, MD   glucose 4 g chewable tablet Take 4 tablets by mouth as needed for Low blood sugar 01/31/23  Yes Delma Officer I, MD   atorvastatin (LIPITOR) 80 MG tablet Take 1 tablet by mouth nightly 01/31/23  Yes Delma Officer I, MD   metoprolol succinate (TOPROL XL) 25 MG extended release tablet Take 1 tablet by mouth daily 02/01/23  Yes  Delma Officer I, MD   lidocaine 4 % external patch Place 1 patch onto the skin daily 02/01/23  Yes Delma Officer I, MD   polyethylene glycol (GLYCOLAX) 17 g packet Take 1 packet by mouth daily as needed for Constipation 01/31/23 03/02/23 Yes Delma Officer I, MD   propylthiouracil (PTU) 50 MG tablet Take 1 tablet by mouth every 8 hours 01/31/23 03/02/23 Yes Delma Officer I, MD   apixaban (ELIQUIS) 2.5 MG TABS tablet Take 1 tablet by mouth 2 times daily 06/04/21   Automatic Reconciliation, Ar   aspirin 81 MG EC tablet Take 1 tablet by mouth daily    Automatic Reconciliation, Ar   furosemide (LASIX) 40 MG tablet Take 1  tablet by mouth daily 06/04/21   Automatic Reconciliation, Ar           Shyrl Numbers, MD

## 2023-01-31 NOTE — Plan of Care (Signed)
Problem: Discharge Planning  Goal: Discharge to home or other facility with appropriate resources  Outcome: Progressing     Problem: Pain  Goal: Verbalizes/displays adequate comfort level or baseline comfort level  Outcome: Progressing     Problem: Chronic Conditions and Co-morbidities  Goal: Patient's chronic conditions and co-morbidity symptoms are monitored and maintained or improved  Outcome: Progressing     Problem: ABCDS Injury Assessment  Goal: Absence of physical injury  Outcome: Progressing

## 2023-01-31 NOTE — Discharge Instructions (Addendum)
Continuity of Care Form    Patient Name: Daniel Arellano   DOB:  1941-01-16  MRN:  [id number]    Admit date:  01/29/2023  Discharge date:  01/31/2023    Code Status Order: Full Code   Advance Directives:     Admitting Physician:  Blake Divine, MD  PCP: Florestine Avers, MD    Discharging Nurse: Sharolyn Douglas  Discharging [facility] Unit/Room#: 567/01  Discharging Unit Phone Number: [phone]    Emergency Contact:   Extended Emergency Contact Information  Primary Emergency Contact: Willouby,Darlene  Mobile Phone: [phone]  Relation: Child    Past Surgical History:  History reviewed. No pertinent surgical history.    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, Z79.4    Atrial fibrillation with rapid ventricular response (HCC) I48.91    Aphasia R47.01    Acute CVA (cerebrovascular accident) (HCC) I63.9       Isolation/Infection:   Isolation            No Isolation          Patient Infection Status       None to display            Nurse Assessment:  Last Vital Signs: BP (!) 96/54   Pulse 83   Temp 98.4 F (36.9 C) (Oral)   Resp 17   Ht 1.778 m (5\' 10" )   Wt 76.7 kg (169 lb 1.5 oz)   SpO2 98%   BMI 24.26 kg/m     Last documented pain score (0-10 scale): Pain Level: 1  Last Weight:   Wt Readings from Last 1 Encounters:   01/29/23 76.7 kg (169 lb 1.5 oz)     Mental Status:  oriented    IV Access:  - None    Nursing Mobility/ADLs:  Walking   Assisted  Transfer  Assisted  Bathing  Assisted  Dressing  Assisted  Toileting  Assisted  Feeding  Independent  Med Admin  Independent  Med Delivery   whole    Wound Care Documentation and Therapy:        Elimination:  Continence:   Bowel:  Yes  Bladder: No  Urinary Catheter: None   Colostomy/Ileostomy/Ileal Conduit: No       Date of Last BM: 01/29/2023    Intake/Output Summary (Last 24 hours) at 01/31/2023 1525  Last data filed at 01/30/2023 1852  Gross per 24 hour   Intake 1000 ml   Output 2600 ml   Net -1600 ml     I/O last 3 completed shifts:  In: 1000 [P.O.:400]  Out: 2600     Safety Concerns:     At Risk for Falls    Impairments/Disabilities:      ***    Nutrition Therapy:  Current Nutrition Therapy:   - Oral Diet:  General    Routes of Feeding: Oral  Liquids: No Restrictions  Daily Fluid Restriction: no  Last Modified Barium Swallow with Video (Video Swallowing Test): {Done Not Done MWNU:272536644}    Treatments at the Time of Hospital Discharge:   Respiratory Treatments: ***  Oxygen Therapy:  {Therapy;  copd oxygen:17808}  Ventilator:    {MH CC Vent NGEX:528413244}    Rehab Therapies: {THERAPEUTIC INTERVENTION:[phone]}  Weight Bearing Status/Restrictions: {MH CC Weight Bearing:304508812}  Other Medical Equipment (for information only, NOT a DME order):  {EQUIPMENT:304520077}  Other Treatments: ***    Patient's personal belongings (please select all that are sent with patient):  {CHP DME Belongings:304088044}    RN SIGNATURE:  Electronically signed by Sharolyn Douglas, RN on 01/31/23 at 3:42 PM EDT

## 2023-02-01 LAB — POCT GLUCOSE: POC Glucose: 159 mg/dL — ABNORMAL HIGH (ref 65–100)

## 2024-03-07 ENCOUNTER — Other Ambulatory Visit: Payer: Self-pay

## 2024-03-07 ENCOUNTER — Encounter (HOSPITAL_COMMUNITY): Payer: Self-pay | Admitting: Surgery

## 2024-03-07 ENCOUNTER — Ambulatory Visit: Payer: Self-pay | Admitting: Surgery

## 2024-03-07 DIAGNOSIS — Z01818 Encounter for other preprocedural examination: Secondary | ICD-10-CM

## 2024-03-07 NOTE — Progress Notes (Signed)
 PCP -Authur Leghorn, MD  EKG - DOS  Anesthesia review: N  Patient verbally denies any shortness of breath, fever, cough and chest pain during phone call   -------------  SDW INSTRUCTIONS given:  Your procedure is scheduled on Wednesday, May 28th.  Report to [facility] Main Entrance "A" at 0700 A.M., and check in at the Admitting office.  Call this number if you have problems the morning of surgery:  [phone]   Remember:  Do not eat after midnight the night before your surgery  You may drink clear liquids until 0630 the morning of your surgery.   Clear liquids allowed are: Water, Non-Citrus Juices (without pulp), Carbonated Beverages, Clear Tea, Black Coffee Only, and Gatorade    Take these medicines the morning of surgery with A SIP OF WATER  N/A  As of today, STOP taking any Aspirin (unless otherwise instructed by your surgeon) Aleve, Naproxen, Ibuprofen, Motrin, Advil, Goody's, BC's, all herbal medications, fish oil, and all vitamins.                      Do not wear jewelry, make up, or nail polish            Do not wear lotions, powders, perfumes/colognes, or deodorant.            Do not shave 48 hours prior to surgery.  Men may shave face and neck.            Do not bring valuables to the hospital.            [facility] is not responsible for any belongings or valuables.  Do NOT Smoke (Tobacco/Vaping) 24 hours prior to your procedure If you use a CPAP at night, you may bring all equipment for your overnight stay.   Contacts, glasses, dentures or bridgework may not be worn into surgery.      For patients admitted to the hospital, discharge time will be determined by your treatment team.   Patients discharged the day of surgery will not be allowed to drive home, and someone needs to stay with them for 24 hours.    Special instructions:   [place]- Preparing For Surgery  Before surgery, you can play an important role. Because skin is not sterile, your skin needs  to be as free of germs as possible. You can reduce the number of germs on your skin by washing with CHG (chlorahexidine gluconate) Soap before surgery.  CHG is an antiseptic cleaner which kills germs and bonds with the skin to continue killing germs even after washing.    Oral Hygiene is also important to reduce your risk of infection.  Remember - BRUSH YOUR TEETH THE MORNING OF SURGERY WITH YOUR REGULAR TOOTHPASTE  Please do not use if you have an allergy to CHG or antibacterial soaps. If your skin becomes reddened/irritated stop using the CHG.  Do not shave (including legs and underarms) for at least 48 hours prior to first CHG shower. It is OK to shave your face.  Please follow these instructions carefully.   Shower the NIGHT BEFORE SURGERY and the MORNING OF SURGERY with DIAL Soap.   Pat yourself dry with a CLEAN TOWEL.  Wear CLEAN PAJAMAS to bed the night before surgery  Place CLEAN SHEETS on your bed the night of your first shower and DO NOT SLEEP WITH PETS.   Day of Surgery: Please shower morning of surgery  Wear Clean/Comfortable clothing the morning of surgery  Do not apply any deodorants/lotions.   Remember to brush your teeth WITH YOUR REGULAR TOOTHPASTE.   Questions were answered. Patient verbalized understanding of instructions.

## 2024-03-12 ENCOUNTER — Other Ambulatory Visit: Payer: Self-pay

## 2024-03-12 ENCOUNTER — Encounter (HOSPITAL_COMMUNITY)
Admission: RE | Disposition: A | Discharge status: Home / Self Care | Payer: Self-pay | Source: Home / Self Care | Attending: Surgery

## 2024-03-12 ENCOUNTER — Encounter (HOSPITAL_COMMUNITY): Payer: Self-pay | Admitting: Surgery

## 2024-03-12 ENCOUNTER — Ambulatory Visit (HOSPITAL_COMMUNITY): Admitting: Anesthesiology

## 2024-03-12 ENCOUNTER — Ambulatory Visit (HOSPITAL_COMMUNITY)
Admission: RE | Admit: 2024-03-12 | Discharge: 2024-03-12 | Disposition: A | Discharge status: Home / Self Care | Attending: Surgery | Admitting: Surgery

## 2024-03-12 DIAGNOSIS — Z87891 Personal history of nicotine dependence: Secondary | ICD-10-CM | POA: Diagnosis not present

## 2024-03-12 DIAGNOSIS — I129 Hypertensive chronic kidney disease with stage 1 through stage 4 chronic kidney disease, or unspecified chronic kidney disease: Secondary | ICD-10-CM | POA: Diagnosis not present

## 2024-03-12 DIAGNOSIS — K648 Other hemorrhoids: Secondary | ICD-10-CM | POA: Diagnosis present

## 2024-03-12 DIAGNOSIS — I10 Essential (primary) hypertension: Secondary | ICD-10-CM | POA: Diagnosis not present

## 2024-03-12 DIAGNOSIS — Z87442 Personal history of urinary calculi: Secondary | ICD-10-CM | POA: Diagnosis not present

## 2024-03-12 DIAGNOSIS — Z01818 Encounter for other preprocedural examination: Secondary | ICD-10-CM

## 2024-03-12 DIAGNOSIS — N1831 Chronic kidney disease, stage 3a: Secondary | ICD-10-CM | POA: Insufficient documentation

## 2024-03-12 HISTORY — DX: Thrombocytopenia, unspecified: D69.6

## 2024-03-12 HISTORY — PX: RECTAL EXAM UNDER ANESTHESIA: SHX6399

## 2024-03-12 HISTORY — DX: Chronic kidney disease, stage 3 unspecified: N18.30

## 2024-03-12 HISTORY — PX: HEMORRHOID SURGERY: SHX153

## 2024-03-12 HISTORY — DX: Deficiency of other specified B group vitamins: E53.8

## 2024-03-12 HISTORY — DX: Essential (primary) hypertension: I10

## 2024-03-12 HISTORY — DX: Polymyalgia rheumatica: M35.3

## 2024-03-12 HISTORY — DX: Prediabetes: R73.03

## 2024-03-12 LAB — CBC WITH DIFFERENTIAL/PLATELET
Abs Immature Granulocytes: 0.03 10*3/uL (ref 0.00–0.07)
Basophils Absolute: 0 10*3/uL (ref 0.0–0.1)
Basophils Relative: 1 %
Eosinophils Absolute: 0 10*3/uL (ref 0.0–0.5)
Eosinophils Relative: 1 %
HCT: 45.6 % (ref 39.0–52.0)
Hemoglobin: 15 g/dL (ref 13.0–17.0)
Immature Granulocytes: 1 %
Lymphocytes Relative: 33 %
Lymphs Abs: 2 10*3/uL (ref 0.7–4.0)
MCH: 33.3 pg (ref 26.0–34.0)
MCHC: 32.9 g/dL (ref 30.0–36.0)
MCV: 101.3 fL — ABNORMAL HIGH (ref 80.0–100.0)
Monocytes Absolute: 0.7 10*3/uL (ref 0.1–1.0)
Monocytes Relative: 12 %
Neutro Abs: 3.4 10*3/uL (ref 1.7–7.7)
Neutrophils Relative %: 52 %
Platelets: 171 10*3/uL (ref 150–400)
RBC: 4.5 MIL/uL (ref 4.22–5.81)
RDW: 13.7 % (ref 11.5–15.5)
WBC: 6.2 10*3/uL (ref 4.0–10.5)
nRBC: 0 % (ref 0.0–0.2)

## 2024-03-12 SURGERY — HEMORRHOIDECTOMY
Anesthesia: General

## 2024-03-12 MED ORDER — LACTATED RINGERS IV SOLN: INTRAVENOUS | Status: DC | PRN

## 2024-03-12 MED ORDER — ROCURONIUM BROMIDE 10 MG/ML (PF) SYRINGE
PREFILLED_SYRINGE | INTRAVENOUS | Status: DC | PRN
  Administered 2024-03-12: 70 mg via INTRAVENOUS

## 2024-03-12 MED ORDER — PROPOFOL 10 MG/ML IV BOLUS
INTRAVENOUS | Status: AC
  Filled 2024-03-12: qty 20

## 2024-03-12 MED ORDER — BUPIVACAINE LIPOSOME 1.3 % IJ SUSP
INTRAMUSCULAR | Status: AC
Start: 2024-03-12 — End: ?
  Filled 2024-03-12: qty 20

## 2024-03-12 MED ORDER — SUGAMMADEX SODIUM 200 MG/2ML IV SOLN
INTRAVENOUS | Status: DC | PRN
  Administered 2024-03-12: 200 mg via INTRAVENOUS

## 2024-03-12 MED ORDER — FLEET ENEMA RE ENEM
1.0000 | ENEMA | Freq: Once | RECTAL | Status: AC
  Administered 2024-03-12: 1 via RECTAL
  Filled 2024-03-12: qty 1

## 2024-03-12 MED ORDER — FLEET ENEMA RE ENEM: 1.0000 | ENEMA | Freq: Once | RECTAL | Status: DC

## 2024-03-12 MED ORDER — DEXAMETHASONE SODIUM PHOSPHATE 10 MG/ML IJ SOLN
INTRAMUSCULAR | Status: DC | PRN
  Administered 2024-03-12: 10 mg via INTRAVENOUS

## 2024-03-12 MED ORDER — ACETAMINOPHEN 500 MG PO TABS
1000.0000 mg | ORAL_TABLET | ORAL | Status: AC
  Administered 2024-03-12: 1000 mg via ORAL
  Filled 2024-03-12: qty 2

## 2024-03-12 MED ORDER — CHLORHEXIDINE GLUCONATE 0.12 % MT SOLN
15.0000 mL | Freq: Once | OROMUCOSAL | Status: AC
  Administered 2024-03-12: 15 mL via OROMUCOSAL
  Filled 2024-03-12: qty 15

## 2024-03-12 MED ORDER — ORAL CARE MOUTH RINSE: 15.0000 mL | Freq: Once | OROMUCOSAL | Status: AC

## 2024-03-12 MED ORDER — PHENYLEPHRINE HCL-NACL 20-0.9 MG/250ML-% IV SOLN
INTRAVENOUS | Status: DC | PRN
  Administered 2024-03-12: 50 ug/min via INTRAVENOUS

## 2024-03-12 MED ORDER — BUPIVACAINE LIPOSOME 1.3 % IJ SUSP: 20.0000 mL | Freq: Once | INTRAMUSCULAR | Status: DC

## 2024-03-12 MED ORDER — ONDANSETRON HCL 4 MG/2ML IJ SOLN
INTRAMUSCULAR | Status: DC | PRN
  Administered 2024-03-12: 5 mg via INTRAVENOUS

## 2024-03-12 MED ORDER — BUPIVACAINE HCL (PF) 0.25 % IJ SOLN
INTRAMUSCULAR | Status: DC | PRN
  Administered 2024-03-12: 30 mL

## 2024-03-12 MED ORDER — FENTANYL CITRATE (PF) 250 MCG/5ML IJ SOLN
INTRAMUSCULAR | Status: DC | PRN
  Administered 2024-03-12 (×2): 100 ug via INTRAVENOUS

## 2024-03-12 MED ORDER — BUPIVACAINE LIPOSOME 1.3 % IJ SUSP
INTRAMUSCULAR | Status: DC | PRN
Start: 2024-03-12 — End: 2024-03-12
  Administered 2024-03-12: 20 mL

## 2024-03-12 MED ORDER — LIDOCAINE 2% (20 MG/ML) 5 ML SYRINGE
INTRAMUSCULAR | Status: DC | PRN
  Administered 2024-03-12: 100 mg via INTRAVENOUS

## 2024-03-12 MED ORDER — PROPOFOL 10 MG/ML IV BOLUS
INTRAVENOUS | Status: DC | PRN
  Administered 2024-03-12: 100 mg via INTRAVENOUS

## 2024-03-12 MED ORDER — FENTANYL CITRATE (PF) 250 MCG/5ML IJ SOLN
INTRAMUSCULAR | Status: AC
  Filled 2024-03-12: qty 5

## 2024-03-12 MED ORDER — LACTATED RINGERS IV SOLN: INTRAVENOUS | Status: DC

## 2024-03-12 MED ORDER — BUPIVACAINE HCL (PF) 0.25 % IJ SOLN
INTRAMUSCULAR | Status: AC
  Filled 2024-03-12: qty 30

## 2024-03-12 MED ORDER — TRAMADOL HCL 50 MG PO TABS: 50.0000 mg | ORAL_TABLET | Freq: Four times a day (QID) | ORAL | 0 refills | Status: AC | PRN

## 2024-03-12 SURGICAL SUPPLY — 30 items
BAG COUNTER SPONGE SURGICOUNT (BAG) ×2 IMPLANT
BLADE SURG 15 STRL LF DISP TIS (BLADE) ×2 IMPLANT
BRIEF MESH DISP LRG (UNDERPADS AND DIAPERS) ×2 IMPLANT
CANISTER SUCTION 3000ML PPV (SUCTIONS) ×2 IMPLANT
COVER SURGICAL LIGHT HANDLE (MISCELLANEOUS) ×2 IMPLANT
DISSECTOR SURG LIGASURE 21 (MISCELLANEOUS) IMPLANT
ELECT CAUTERY BLADE 6.4 (BLADE) ×2 IMPLANT
GAUZE PAD ABD 8X10 STRL (GAUZE/BANDAGES/DRESSINGS) ×2 IMPLANT
GAUZE SPONGE 4X4 12PLY STRL (GAUZE/BANDAGES/DRESSINGS) ×2 IMPLANT
GLOVE BIO SURGEON STRL SZ7.5 (GLOVE) ×2 IMPLANT
GLOVE INDICATOR 8.0 STRL GRN (GLOVE) ×2 IMPLANT
GOWN STRL REUS W/ TWL LRG LVL3 (GOWN DISPOSABLE) ×2 IMPLANT
GOWN STRL REUS W/ TWL XL LVL3 (GOWN DISPOSABLE) ×2 IMPLANT
KIT BASIN OR (CUSTOM PROCEDURE TRAY) ×2 IMPLANT
KIT TURNOVER KIT B (KITS) ×2 IMPLANT
NDL HYPO 25GX1X1/2 BEV (NEEDLE) ×2 IMPLANT
NEEDLE HYPO 25GX1X1/2 BEV (NEEDLE) ×2 IMPLANT
NS IRRIG 1000ML POUR BTL (IV SOLUTION) ×2 IMPLANT
PACK LITHOTOMY IV (CUSTOM PROCEDURE TRAY) ×2 IMPLANT
PAD ARMBOARD POSITIONER FOAM (MISCELLANEOUS) ×2 IMPLANT
PENCIL BUTTON HOLSTER BLD 10FT (ELECTRODE) ×2 IMPLANT
SPONGE HEMORRHOID 8X3CM (HEMOSTASIS) IMPLANT
SPONGE T-LAP 4X18 ~~LOC~~+RFID (SPONGE) ×2 IMPLANT
SURGILUBE 2OZ TUBE FLIPTOP (MISCELLANEOUS) ×2 IMPLANT
SUT CHROMIC 3 0 SH 27 (SUTURE) ×2 IMPLANT
SUT VIC AB 2-0 UR6 27 (SUTURE) IMPLANT
SYR CONTROL 10ML LL (SYRINGE) ×2 IMPLANT
TOWEL GREEN STERILE (TOWEL DISPOSABLE) ×2 IMPLANT
TUBE CONNECTING 12X1/4 (SUCTIONS) ×2 IMPLANT
YANKAUER SUCT BULB TIP NO VENT (SUCTIONS) ×2 IMPLANT

## 2024-03-12 NOTE — Progress Notes (Signed)
 Lab called stating BMET hemolyzed. Anesthesia made aware and stated no need to redraw lab. Order dc'd in EPIC.

## 2024-03-12 NOTE — Anesthesia Postprocedure Evaluation (Signed)
 Anesthesia Post Note  Patient: Tyler Wade  Procedure(s) Performed: HEMORRHOIDECTOMY EXAM UNDER ANESTHESIA, RECTUM     Patient location during evaluation: PACU Anesthesia Type: General Level of consciousness: awake and alert and oriented Pain management: pain level controlled Vital Signs Assessment: post-procedure vital signs reviewed and stable Respiratory status: spontaneous breathing, nonlabored ventilation and respiratory function stable Cardiovascular status: blood pressure returned to baseline and stable Postop Assessment: no apparent nausea or vomiting Anesthetic complications: no   There were no known notable events for this encounter.  Last Vitals:  Vitals:   03/12/24 1045 03/12/24 1050  BP: (!) 170/68 (!) 174/64  Pulse: (!) 53 (!) 46  Resp: 17 11  Temp:  36.5 C  SpO2: 96% 96%    Last Pain:  Vitals:   03/12/24 1020  TempSrc:   PainSc: 0-No pain                 Hanna Aultman A.

## 2024-03-12 NOTE — H&P (Signed)
 CC: Here today for surgery  HPI: Tyler Wade is an 83 y.o. male with history of kidney stones, whom is seen in the office previously as a referral by Dr. Eluterio Hamburg for evaluation of possible hemorrhoids.   He reports that for several years he has struggled with issues with prolapsing tissue from the anal canal that is typically out 80% of the time but will occasionally be able to be reduced. Today, that seems to be the case. He reports discomfort particular with prolonged drives. Rare bright red blood per rectum. He denies any significant pain with defecation. He takes MiraLAX every other day and has done so for many years and with this, his stools are consistently soft. He reports no straining with bowel movements and generally takes 3 to 4 minutes on the commode. He will spend additional time however with hygiene as the prolapsed hemorrhoidal tissue will occasionally rip the toilet paper and require him to use a moist towel.  He drinks upwards of 6 to 8 glasses of water per day. He denies any prior hemorrhoid surgery or anorectal surgeries. He follows with Dr. Elvin Hammer for endoscopic surveillance of his colon. He reports he had a colonoscopy done 5 years ago that was normal. He reports that he was told he needed no further endoscopic evaluation of his colon.  He denies any changes in health or health history since we met in the office. No new medications/allergies. He states he is ready for surgery today.  PMH: Kidney stones  PSH: He denies any prior anorectal surgeries or procedures; right hip replacement, 2020; kidney stone removals; cataract surgery  FHx: Denies any known family history of colorectal, breast, endometrial or ovarian cancer  Social Hx: Denies use of tobacco/EtOH/illicit drug. He is here today with his daughter   Past Medical History:  Diagnosis Date   BPH (benign prostatic hypertrophy)    Chronic kidney disease (CKD), stage III (moderate) (HCC)    3A   History of kidney  stones    History of skin cancer    Hypertension    Nocturia    Polymyalgia rheumatica (HCC)    Pre-diabetes    Renal calculus, right    Thrombocytopenia (HCC)    Vitamin B12 deficiency    Wears glasses     Past Surgical History:  Procedure Laterality Date   CATARACT EXTRACTION Bilateral    05/2019   CYSTOSCOPY  06/17/1979   FOR KIDNEY STONES   CYSTOSCOPY WITH URETEROSCOPY AND STENT PLACEMENT Right 12/29/2015   Procedure: RIGHT  URETEROSCOPY; BASKET STONE REMOVAL AND STENT EXCHANGE; RIGHT RETROGRADE PYELOGRAM;  Surgeon: Andrez Banker, MD;  Location: [facility];  Service: Urology;  Laterality: Right;   NEPHROLITHOTOMY Right 12/09/2015   Procedure: NEPHROLITHOTOMY RIGHT PERCUTANEOUS WITH SURGEON ACCESS;  Surgeon: Andrez Banker, MD;  Location: [facility];  Service: Urology;  Laterality: Right;   TONSILLECTOMY  child    History reviewed. No pertinent family history.  Social:  reports that he quit smoking about 57 years ago. His smoking use included cigarettes. He started smoking about 62 years ago. He has a 5 pack-year smoking history. He has never used smokeless tobacco. He reports that he does not drink alcohol and does not use drugs.  Allergies: No Known Allergies  Medications: I have reviewed the patient's current medications.  Results for orders placed or performed during the hospital encounter of 03/12/24 (from the past 48 hours)  CBC with Differential/Platelet     Status: Abnormal  Collection Time: 03/12/24  7:45 AM  Result Value Ref Range   WBC 6.2 4.0 - 10.5 K/uL   RBC 4.50 4.22 - 5.81 MIL/uL   Hemoglobin 15.0 13.0 - 17.0 g/dL   HCT 29.5 62.1 - 30.8 %   MCV 101.3 (H) 80.0 - 100.0 fL   MCH 33.3 26.0 - 34.0 pg   MCHC 32.9 30.0 - 36.0 g/dL   RDW 65.7 84.6 - 96.2 %   Platelets 171 150 - 400 K/uL   nRBC 0.0 0.0 - 0.2 %   Neutrophils Relative % 52 %   Neutro Abs 3.4 1.7 - 7.7 K/uL   Lymphocytes Relative 33 %   Lymphs Abs 2.0 0.7 - 4.0 K/uL    Monocytes Relative 12 %   Monocytes Absolute 0.7 0.1 - 1.0 K/uL   Eosinophils Relative 1 %   Eosinophils Absolute 0.0 0.0 - 0.5 K/uL   Basophils Relative 1 %   Basophils Absolute 0.0 0.0 - 0.1 K/uL   Immature Granulocytes 1 %   Abs Immature Granulocytes 0.03 0.00 - 0.07 K/uL    Comment: Performed at [facility], [address], [place], [place] [address]    No results found.   PE Blood pressure (!) 188/79, pulse 63, temperature 97.8 F (36.6 C), temperature source Oral, resp. rate 18, height 5\' 11"  (1.803 m), weight 89.4 kg, SpO2 97%. Constitutional: NAD; conversant Eyes: Moist conjunctiva; no lid lag; anicteric Lungs: Normal respiratory effort CV: RRR Psychiatric: Appropriate affect  Results for orders placed or performed during the hospital encounter of 03/12/24 (from the past 48 hours)  CBC with Differential/Platelet     Status: Abnormal   Collection Time: 03/12/24  7:45 AM  Result Value Ref Range   WBC 6.2 4.0 - 10.5 K/uL   RBC 4.50 4.22 - 5.81 MIL/uL   Hemoglobin 15.0 13.0 - 17.0 g/dL   HCT 13.2 44.0 - 10.2 %   MCV 101.3 (H) 80.0 - 100.0 fL   MCH 33.3 26.0 - 34.0 pg   MCHC 32.9 30.0 - 36.0 g/dL   RDW 72.5 36.6 - 44.0 %   Platelets 171 150 - 400 K/uL   nRBC 0.0 0.0 - 0.2 %   Neutrophils Relative % 52 %   Neutro Abs 3.4 1.7 - 7.7 K/uL   Lymphocytes Relative 33 %   Lymphs Abs 2.0 0.7 - 4.0 K/uL   Monocytes Relative 12 %   Monocytes Absolute 0.7 0.1 - 1.0 K/uL   Eosinophils Relative 1 %   Eosinophils Absolute 0.0 0.0 - 0.5 K/uL   Basophils Relative 1 %   Basophils Absolute 0.0 0.0 - 0.1 K/uL   Immature Granulocytes 1 %   Abs Immature Granulocytes 0.03 0.00 - 0.07 K/uL    Comment: Performed at [facility], [address], [place], [place] [address]    No results found.  A/P: Tyler Wade is an 83 y.o. male with hx of kidney stones, macular degeneration here for evaluation of prolapsing internal hemorrhoids  -The anatomy and physiology of the  anal canal was discussed with the patient with associated pictures. The pathophysiology of hemorrhoids was discussed at length with associated pictures and illustrations. -We have reviewed options going forward including further observation vs surgery -hemorrhoidectomy; anorectal exam under anesthesia - I suspect with how well optimized he currently is from a bowel habit standpoint, further attempts at "medical management" for grade 3/4 internal hemorrhoids are unlikely to resolve his issues. We therefore discussed surgery  as an option. -The planned procedure, material risks (including, but not limited to, pain, bleeding, infection, scarring, need for blood transfusion, damage to anal sphincter, incontinence of gas and/or stool, need for additional procedures, anal stenosis, rare cases of pelvic sepsis which in severe cases may require things like a colostomy, recurrence, pneumonia, heart attack, stroke, death) benefits and alternatives to surgery were discussed at length. I noted a good probability that the procedure would help improve their symptoms. The patient's and his daughter's questions were answered to their satisfaction, they voiced understanding and elected to proceed with surgery. Additionally, we discussed typical postoperative expectations and the recovery process.   Beatris Lincoln, MD [facility]

## 2024-03-12 NOTE — Anesthesia Preprocedure Evaluation (Signed)
 Anesthesia Evaluation  Patient identified by MRN, date of birth, ID band Patient awake    Reviewed: Allergy & Precautions, NPO status , Patient's Chart, lab work & pertinent test results  Airway Mallampati: I  TM Distance: >3 FB     Dental no notable dental hx. (+) Teeth Intact, Caps, Dental Advisory Given   Pulmonary former smoker   Pulmonary exam normal breath sounds clear to auscultation       Cardiovascular hypertension, Pt. on medications Normal cardiovascular exam Rhythm:Regular Rate:Normal     Neuro/Psych negative neurological ROS  negative psych ROS   GI/Hepatic   Endo/Other  negative endocrine ROS    Renal/GU Renal InsufficiencyRenal disease  negative genitourinary   Musculoskeletal negative musculoskeletal ROS (+)    Abdominal   Peds  Hematology negative hematology ROS (+)   Anesthesia Other Findings   Reproductive/Obstetrics                             Anesthesia Physical Anesthesia Plan  ASA: 2  Anesthesia Plan: General   Post-op Pain Management: Minimal or no pain anticipated   Induction: Intravenous  PONV Risk Score and Plan: 3 and Treatment may vary due to age or medical condition, Ondansetron  and Dexamethasone   Airway Management Planned: Oral ETT  Additional Equipment: None  Intra-op Plan:   Post-operative Plan: Extubation in OR  Informed Consent: I have reviewed the patients History and Physical, chart, labs and discussed the procedure including the risks, benefits and alternatives for the proposed anesthesia with the patient or authorized representative who has indicated his/her understanding and acceptance.     Dental advisory given  Plan Discussed with: CRNA and Anesthesiologist  Anesthesia Plan Comments:        Anesthesia Quick Evaluation

## 2024-03-12 NOTE — Op Note (Signed)
 03/12/2024  10:05 AM  PATIENT:  Tyler Wade  83 y.o. male  Patient Care Team: Abbe Hoard., MD as PCP - General (Internal Medicine)  PRE-OPERATIVE DIAGNOSIS:  Prolapsing internal hemorrhoids  POST-OPERATIVE DIAGNOSIS:  Same  PROCEDURE:   two column internal hemorrhoidectomy Anorectal exam under anestheisa  SURGEON:  Surgeon(s): Melvenia Stabs, MD  ASSISTANT: OR Staff   ANESTHESIA:   local and general  SPECIMEN:   Left posterior hemorrhoidal tissue Right posterior hemorrhoidal tissue  DISPOSITION OF SPECIMEN:  PATHOLOGY  COUNTS:  Sponge, needle, and instrument counts were reported correct x2 at conclusion.  EBL: 10 mL  Drains: none  PLAN OF CARE: Discharge to home after PACU  PATIENT DISPOSITION:  PACU - hemodynamically stable.  OR FINDINGS: At least 2 columns of internal hemorrhoidal tissue that appeared to be the prolapsing components that were bothersome to him.  2 column hemorrhoidectomy carried out.  No other pertinent findings on circumferential anoscopy.  No fissures.  No distal rectal pathology.  DESCRIPTION: The patient was identified in the preoperative holding area and taken to the OR. SCDs were applied.  The then underwent general endotracheal anesthesia without difficulty. The patient was then rolled onto the OR table in the prone jackknife position. Pressure points were then evaluated and padded. Benzoin was applied to the buttocks and they were gently taped apart.  The was then prepped and draped in usual sterile fashion.  A surgical timeout was performed indicating the correct patient, procedure, and positioning.  A perianal block was then created using a dilute mixture of 0.25% Marcaine  with epinephrine  and Exparel .  After ascertaining an appropriate level of anesthesia had been achieved, a well lubricated digital rectal exam was performed. This demonstrated no palpable masses or other abnormalities.  A Hill-Ferguson anoscope was into the anal  canal and circumferential inspection demonstrated healthy appearing anoderm.  He does have a fairly large hemorrhoidal component that is internal and prolapsing in the left posterior position.  There is also a more moderate size internal hemorrhoid in the right posterior position.  There are no fissures.  There is no other evident pathology within the anal canal or distal rectum.    Attention is first directed at the left posterior hemorrhoidal tissue which is the largest.  This component with the anoscope in place is elevated the DeBakey forcep.  There is no large external component to this.  This is then excised in an elliptical type manner dissecting free of the underlying internal sphincter muscle.  No muscle was divided.  After this has been freed, the pedicle was ligated and divided using the hand-held LigaSure device for hemostasis purposes.  The hemorrhoidal tissue was passed off.  Small hemorrhoidal varicosities overlying the sphincter muscle were fulgurated.  Hemostasis is achieved.  The defect was then closed using a running 2-0 Vicryl suture after suture ligating the pedicle.  Attention is then directed at the right posterior hemorrhoidal tissue.  There is no large external component to this tenderness primarily internal.  This is elevated similarly with a DeBakey forcep. This is then excised in an elliptical type manner dissecting free of the underlying internal sphincter muscle.  No muscle was divided.  After this has been freed, the pedicle was ligated and divided using the hand-held LigaSure device for hemostasis purposes.  The hemorrhoidal tissue was passed off.  Small hemorrhoidal varicosities overlying the sphincter muscle were fulgurated.  Hemostasis is achieved.  The defect was then closed using a running 2-0 Vicryl suture  after suture ligating the pedicle.  Anal canal is irrigated.  Hemostasis is verified.  All sponge, needle, and instrument counts were reported correct.  Additional local  anesthetic was infiltrated at the excision sites.  A piece of Surgifoam hemostatic sponge is placed within the anal canal.  The buttocks are untaped.  Ultimately, a dressing consisting of 4 x 4's, ABD, mesh underwear was placed.  He was rolled back onto a stretcher, awakened from anesthesia, extubated, and transported to the recovery room in satisfactory condition.  DISPOSITION: PACU in satisfactory condition.

## 2024-03-12 NOTE — Anesthesia Procedure Notes (Signed)
 Procedure Name: Intubation Date/Time: 03/12/2024 9:21 AM  Performed by: Loreda Rodriguez, CRNAPre-anesthesia Checklist: Patient identified, Emergency Drugs available, Suction available and Patient being monitored Patient Re-evaluated:Patient Re-evaluated prior to induction Oxygen Delivery Method: Circle System Utilized Preoxygenation: Pre-oxygenation with 100% oxygen Induction Type: IV induction Ventilation: Mask ventilation without difficulty Laryngoscope Size: Mac and 4 Grade View: Grade I Tube type: Oral Tube size: 7.0 mm Number of attempts: 1 Airway Equipment and Method: Stylet and Oral airway Placement Confirmation: ETT inserted through vocal cords under direct vision, positive ETCO2 and breath sounds checked- equal and bilateral Secured at: 22 cm Tube secured with: Tape Dental Injury: Teeth and Oropharynx as per pre-operative assessment

## 2024-03-12 NOTE — Transfer of Care (Signed)
 Immediate Anesthesia Transfer of Care Note  Patient: Tyler Wade  Procedure(s) Performed: HEMORRHOIDECTOMY EXAM UNDER ANESTHESIA, RECTUM  Patient Location: PACU  Anesthesia Type:General  Level of Consciousness: awake, alert , and oriented  Airway & Oxygen Therapy: Patient Spontanous Breathing and Patient connected to nasal cannula oxygen  Post-op Assessment: Report given to RN and Post -op Vital signs reviewed and stable  Post vital signs: Reviewed and stable  Last Vitals:  Vitals Value Taken Time  BP 184/69 03/12/24 1020  Temp 36.5 C 03/12/24 1020  Pulse 51 03/12/24 1021  Resp 16 03/12/24 1021  SpO2 96 % 03/12/24 1021  Vitals shown include unfiled device data.  Last Pain:  Vitals:   03/12/24 1020  TempSrc:   PainSc: 0-No pain      Patients Stated Pain Goal: 0 (03/12/24 0724)  Complications: No notable events documented.

## 2024-03-13 ENCOUNTER — Encounter (HOSPITAL_COMMUNITY): Payer: Self-pay | Admitting: Surgery

## 2024-03-13 LAB — SURGICAL PATHOLOGY
